# Patient Record
Sex: Male | Born: 1965 | Race: White | Hispanic: No | Marital: Single
Health system: Southern US, Community
[De-identification: ages and names within clinical notes are randomized; demographics above are authoritative.]

## PROBLEM LIST (undated history)

## (undated) DIAGNOSIS — C3490 Malignant neoplasm of unspecified part of unspecified bronchus or lung: Secondary | ICD-10-CM

---

## 2015-01-23 DIAGNOSIS — R51 Headache: Secondary | ICD-10-CM | POA: Diagnosis not present

## 2015-01-23 DIAGNOSIS — R112 Nausea with vomiting, unspecified: Secondary | ICD-10-CM | POA: Diagnosis not present

## 2015-01-23 DIAGNOSIS — F17210 Nicotine dependence, cigarettes, uncomplicated: Secondary | ICD-10-CM | POA: Diagnosis not present

## 2015-01-23 DIAGNOSIS — R531 Weakness: Secondary | ICD-10-CM | POA: Diagnosis not present

## 2015-01-23 DIAGNOSIS — R404 Transient alteration of awareness: Secondary | ICD-10-CM | POA: Diagnosis not present

## 2015-01-23 DIAGNOSIS — Z7982 Long term (current) use of aspirin: Secondary | ICD-10-CM | POA: Diagnosis not present

## 2015-01-23 DIAGNOSIS — R200 Anesthesia of skin: Secondary | ICD-10-CM | POA: Diagnosis not present

## 2015-01-23 DIAGNOSIS — Z8673 Personal history of transient ischemic attack (TIA), and cerebral infarction without residual deficits: Secondary | ICD-10-CM | POA: Diagnosis not present

## 2016-04-24 DIAGNOSIS — M25559 Pain in unspecified hip: Secondary | ICD-10-CM | POA: Diagnosis not present

## 2016-04-24 DIAGNOSIS — M5442 Lumbago with sciatica, left side: Secondary | ICD-10-CM | POA: Diagnosis not present

## 2016-04-24 DIAGNOSIS — M545 Low back pain: Secondary | ICD-10-CM | POA: Diagnosis not present

## 2016-04-24 DIAGNOSIS — G8929 Other chronic pain: Secondary | ICD-10-CM | POA: Diagnosis not present

## 2018-12-13 ENCOUNTER — Emergency Department (HOSPITAL_COMMUNITY): Payer: Medicare Other

## 2018-12-13 ENCOUNTER — Encounter (HOSPITAL_COMMUNITY): Payer: Self-pay | Admitting: Emergency Medicine

## 2018-12-13 ENCOUNTER — Inpatient Hospital Stay (HOSPITAL_COMMUNITY): Payer: Medicare Other

## 2018-12-13 ENCOUNTER — Inpatient Hospital Stay (HOSPITAL_COMMUNITY)
Admission: EM | Admit: 2018-12-13 | Discharge: 2019-01-21 | DRG: 023 | Disposition: E | Payer: Medicare Other | Attending: Neurology | Admitting: Neurology

## 2018-12-13 ENCOUNTER — Other Ambulatory Visit: Payer: Self-pay

## 2018-12-13 DIAGNOSIS — R29810 Facial weakness: Secondary | ICD-10-CM | POA: Diagnosis not present

## 2018-12-13 DIAGNOSIS — Z79899 Other long term (current) drug therapy: Secondary | ICD-10-CM

## 2018-12-13 DIAGNOSIS — Z978 Presence of other specified devices: Secondary | ICD-10-CM | POA: Diagnosis not present

## 2018-12-13 DIAGNOSIS — G935 Compression of brain: Secondary | ICD-10-CM | POA: Diagnosis not present

## 2018-12-13 DIAGNOSIS — J9600 Acute respiratory failure, unspecified whether with hypoxia or hypercapnia: Secondary | ICD-10-CM | POA: Diagnosis not present

## 2018-12-13 DIAGNOSIS — R4701 Aphasia: Secondary | ICD-10-CM | POA: Diagnosis present

## 2018-12-13 DIAGNOSIS — E1165 Type 2 diabetes mellitus with hyperglycemia: Secondary | ICD-10-CM | POA: Diagnosis not present

## 2018-12-13 DIAGNOSIS — J9601 Acute respiratory failure with hypoxia: Secondary | ICD-10-CM | POA: Diagnosis not present

## 2018-12-13 DIAGNOSIS — Z9221 Personal history of antineoplastic chemotherapy: Secondary | ICD-10-CM

## 2018-12-13 DIAGNOSIS — Z85818 Personal history of malignant neoplasm of other sites of lip, oral cavity, and pharynx: Secondary | ICD-10-CM | POA: Diagnosis not present

## 2018-12-13 DIAGNOSIS — Z515 Encounter for palliative care: Secondary | ICD-10-CM | POA: Diagnosis not present

## 2018-12-13 DIAGNOSIS — E785 Hyperlipidemia, unspecified: Secondary | ICD-10-CM | POA: Diagnosis present

## 2018-12-13 DIAGNOSIS — R0902 Hypoxemia: Secondary | ICD-10-CM | POA: Diagnosis not present

## 2018-12-13 DIAGNOSIS — J9690 Respiratory failure, unspecified, unspecified whether with hypoxia or hypercapnia: Secondary | ICD-10-CM

## 2018-12-13 DIAGNOSIS — G932 Benign intracranial hypertension: Secondary | ICD-10-CM | POA: Diagnosis not present

## 2018-12-13 DIAGNOSIS — R1312 Dysphagia, oropharyngeal phase: Secondary | ICD-10-CM | POA: Diagnosis not present

## 2018-12-13 DIAGNOSIS — R414 Neurologic neglect syndrome: Secondary | ICD-10-CM | POA: Diagnosis not present

## 2018-12-13 DIAGNOSIS — R918 Other nonspecific abnormal finding of lung field: Secondary | ICD-10-CM

## 2018-12-13 DIAGNOSIS — E870 Hyperosmolality and hypernatremia: Secondary | ICD-10-CM | POA: Diagnosis not present

## 2018-12-13 DIAGNOSIS — R29724 NIHSS score 24: Secondary | ICD-10-CM | POA: Diagnosis present

## 2018-12-13 DIAGNOSIS — Z7982 Long term (current) use of aspirin: Secondary | ICD-10-CM | POA: Diagnosis not present

## 2018-12-13 DIAGNOSIS — G8191 Hemiplegia, unspecified affecting right dominant side: Secondary | ICD-10-CM | POA: Diagnosis present

## 2018-12-13 DIAGNOSIS — Z9911 Dependence on respirator [ventilator] status: Secondary | ICD-10-CM | POA: Diagnosis not present

## 2018-12-13 DIAGNOSIS — I6389 Other cerebral infarction: Secondary | ICD-10-CM | POA: Diagnosis not present

## 2018-12-13 DIAGNOSIS — Z923 Personal history of irradiation: Secondary | ICD-10-CM

## 2018-12-13 DIAGNOSIS — Z85118 Personal history of other malignant neoplasm of bronchus and lung: Secondary | ICD-10-CM | POA: Diagnosis not present

## 2018-12-13 DIAGNOSIS — R069 Unspecified abnormalities of breathing: Secondary | ICD-10-CM | POA: Diagnosis not present

## 2018-12-13 DIAGNOSIS — G936 Cerebral edema: Secondary | ICD-10-CM | POA: Diagnosis present

## 2018-12-13 DIAGNOSIS — I10 Essential (primary) hypertension: Secondary | ICD-10-CM | POA: Diagnosis present

## 2018-12-13 DIAGNOSIS — R0602 Shortness of breath: Secondary | ICD-10-CM | POA: Diagnosis not present

## 2018-12-13 DIAGNOSIS — G9340 Encephalopathy, unspecified: Secondary | ICD-10-CM | POA: Diagnosis not present

## 2018-12-13 DIAGNOSIS — Z66 Do not resuscitate: Secondary | ICD-10-CM | POA: Diagnosis not present

## 2018-12-13 DIAGNOSIS — E119 Type 2 diabetes mellitus without complications: Secondary | ICD-10-CM | POA: Diagnosis not present

## 2018-12-13 DIAGNOSIS — R1310 Dysphagia, unspecified: Secondary | ICD-10-CM | POA: Diagnosis present

## 2018-12-13 DIAGNOSIS — R4781 Slurred speech: Secondary | ICD-10-CM | POA: Diagnosis not present

## 2018-12-13 DIAGNOSIS — F17210 Nicotine dependence, cigarettes, uncomplicated: Secondary | ICD-10-CM | POA: Diagnosis present

## 2018-12-13 DIAGNOSIS — I63512 Cerebral infarction due to unspecified occlusion or stenosis of left middle cerebral artery: Principal | ICD-10-CM | POA: Diagnosis present

## 2018-12-13 DIAGNOSIS — I63412 Cerebral infarction due to embolism of left middle cerebral artery: Secondary | ICD-10-CM | POA: Diagnosis not present

## 2018-12-13 DIAGNOSIS — J189 Pneumonia, unspecified organism: Secondary | ICD-10-CM | POA: Diagnosis not present

## 2018-12-13 DIAGNOSIS — R740 Nonspecific elevation of levels of transaminase and lactic acid dehydrogenase [LDH]: Secondary | ICD-10-CM | POA: Diagnosis not present

## 2018-12-13 DIAGNOSIS — I6523 Occlusion and stenosis of bilateral carotid arteries: Secondary | ICD-10-CM | POA: Diagnosis not present

## 2018-12-13 DIAGNOSIS — I639 Cerebral infarction, unspecified: Secondary | ICD-10-CM

## 2018-12-13 DIAGNOSIS — J398 Other specified diseases of upper respiratory tract: Secondary | ICD-10-CM

## 2018-12-13 DIAGNOSIS — Z9889 Other specified postprocedural states: Secondary | ICD-10-CM | POA: Diagnosis not present

## 2018-12-13 DIAGNOSIS — Z4682 Encounter for fitting and adjustment of non-vascular catheter: Secondary | ICD-10-CM | POA: Diagnosis not present

## 2018-12-13 DIAGNOSIS — E876 Hypokalemia: Secondary | ICD-10-CM | POA: Diagnosis not present

## 2018-12-13 DIAGNOSIS — D72829 Elevated white blood cell count, unspecified: Secondary | ICD-10-CM | POA: Diagnosis not present

## 2018-12-13 DIAGNOSIS — R509 Fever, unspecified: Secondary | ICD-10-CM | POA: Diagnosis not present

## 2018-12-13 DIAGNOSIS — R0689 Other abnormalities of breathing: Secondary | ICD-10-CM | POA: Diagnosis not present

## 2018-12-13 HISTORY — DX: Malignant neoplasm of unspecified part of unspecified bronchus or lung: C34.90

## 2018-12-13 LAB — DIFFERENTIAL
Abs Immature Granulocytes: 0.16 10*3/uL — ABNORMAL HIGH (ref 0.00–0.07)
BASOS ABS: 0.1 10*3/uL (ref 0.0–0.1)
Basophils Relative: 0 %
Eosinophils Absolute: 0 10*3/uL (ref 0.0–0.5)
Eosinophils Relative: 0 %
Immature Granulocytes: 1 %
Lymphocytes Relative: 3 %
Lymphs Abs: 0.6 10*3/uL — ABNORMAL LOW (ref 0.7–4.0)
MONO ABS: 0.5 10*3/uL (ref 0.1–1.0)
Monocytes Relative: 2 %
Neutro Abs: 21.7 10*3/uL — ABNORMAL HIGH (ref 1.7–7.7)
Neutrophils Relative %: 94 %

## 2018-12-13 LAB — CBC
HCT: 49.3 % (ref 39.0–52.0)
Hemoglobin: 15.9 g/dL (ref 13.0–17.0)
MCH: 32.4 pg (ref 26.0–34.0)
MCHC: 32.3 g/dL (ref 30.0–36.0)
MCV: 100.6 fL — ABNORMAL HIGH (ref 80.0–100.0)
Platelets: 348 10*3/uL (ref 150–400)
RBC: 4.9 MIL/uL (ref 4.22–5.81)
RDW: 12.2 % (ref 11.5–15.5)
WBC: 23 10*3/uL — AB (ref 4.0–10.5)
nRBC: 0 % (ref 0.0–0.2)

## 2018-12-13 LAB — COMPREHENSIVE METABOLIC PANEL
ALT: 20 U/L (ref 0–44)
AST: 24 U/L (ref 15–41)
Albumin: 4.1 g/dL (ref 3.5–5.0)
Alkaline Phosphatase: 87 U/L (ref 38–126)
Anion gap: 13 (ref 5–15)
BUN: 11 mg/dL (ref 6–20)
CALCIUM: 9.3 mg/dL (ref 8.9–10.3)
CO2: 24 mmol/L (ref 22–32)
Chloride: 97 mmol/L — ABNORMAL LOW (ref 98–111)
Creatinine, Ser: 1.05 mg/dL (ref 0.61–1.24)
GFR calc Af Amer: >60 mL/min (ref >60)
GFR calc non Af Amer: >60 mL/min (ref >60)
Glucose, Bld: 165 mg/dL — ABNORMAL HIGH (ref 70–99)
Potassium: 4.6 mmol/L (ref 3.5–5.1)
Sodium: 134 mmol/L — ABNORMAL LOW (ref 135–145)
Total Bilirubin: 0.8 mg/dL (ref 0.3–1.2)
Total Protein: 7.5 g/dL (ref 6.5–8.1)

## 2018-12-13 LAB — RETICULOCYTES
IMMATURE RETIC FRACT: 6.2 % (ref 2.3–15.9)
RBC.: 4.87 MIL/uL (ref 4.22–5.81)
Retic Count, Absolute: 65.7 10*3/uL (ref 19.0–186.0)
Retic Ct Pct: 1.4 % (ref 0.4–3.1)

## 2018-12-13 LAB — PROTIME-INR
INR: 0.95
Prothrombin Time: 12.6 seconds (ref 11.4–15.2)

## 2018-12-13 LAB — GLUCOSE, CAPILLARY: Glucose-Capillary: 131 mg/dL — ABNORMAL HIGH (ref 70–99)

## 2018-12-13 LAB — CBG MONITORING, ED: Glucose-Capillary: 156 mg/dL — ABNORMAL HIGH (ref 70–99)

## 2018-12-13 LAB — APTT: aPTT: 29 seconds (ref 24–36)

## 2018-12-13 LAB — I-STAT CREATININE, ED: Creatinine, Ser: 1.1 mg/dL (ref 0.61–1.24)

## 2018-12-13 LAB — I-STAT TROPONIN, ED: Troponin i, poc: 0 ng/mL (ref 0.00–0.08)

## 2018-12-13 MED ORDER — SENNOSIDES-DOCUSATE SODIUM 8.6-50 MG PO TABS: 1.0000 | ORAL_TABLET | Freq: Every evening | ORAL | Status: DC | PRN

## 2018-12-13 MED ORDER — ACETAMINOPHEN 650 MG RE SUPP: 650.0000 mg | RECTAL | Status: DC | PRN

## 2018-12-13 MED ORDER — ACETAMINOPHEN 325 MG PO TABS: 650.0000 mg | ORAL_TABLET | ORAL | Status: DC | PRN

## 2018-12-13 MED ORDER — SODIUM CHLORIDE 0.9% FLUSH: 3.0000 mL | Freq: Once | INTRAVENOUS | Status: DC

## 2018-12-13 MED ORDER — IOPAMIDOL (ISOVUE-370) INJECTION 76%
100.0000 mL | Freq: Once | INTRAVENOUS | Status: AC | PRN
  Administered 2018-12-13: 100 mL via INTRAVENOUS

## 2018-12-13 MED ORDER — ACETAMINOPHEN 160 MG/5ML PO SOLN
650.0000 mg | ORAL | Status: DC | PRN
  Administered 2018-12-16 – 2018-12-22 (×10): 650 mg
  Filled 2018-12-13 (×10): qty 20.3

## 2018-12-13 MED ORDER — SODIUM CHLORIDE 0.9 % IV SOLN
INTRAVENOUS | Status: DC
  Administered 2018-12-13: 23:00:00 via INTRAVENOUS

## 2018-12-13 MED ORDER — STROKE: EARLY STAGES OF RECOVERY BOOK
Freq: Once | Status: DC
  Filled 2018-12-13: qty 1

## 2018-12-13 NOTE — H&P (Signed)
Chief Complaint: Nonverbal, not moving right side   History obtained from: Patient and Chart     HPI:                                                                                                                                       Joshua Beck is an 53 y.o. 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  Last known normal was 4 PM this afternoon seen by daughter.  The patient lives with his daughter and was talking normally.  The daughter went to the grocery store and when she came back she noticed the patient was not speaking and called EMS.  On assessment patient was nonverbal, a gaze deviation and weak on the right side. Blood pressure was 297 systolic.    ED course:  On arrival to [facility] emergency room patient underwent a stat CT head which already showed a large evolving left MCA infarction.  CT aspects was a 3.  Patient not a candidate for TPA is outside the 4.5-hour window.  Not a candidate for intervention due to already large infarct.    Date last known well: 1.22.20 Time last known well: 4pm tPA Given: no,outside window  NIHSS: 24 Baseline MRS 0-1     Past Medical History:  Diagnosis Date  . Lung cancer ([place])      No family history on file.\  Social History: active smoker Allergies: No Known Allergies  Medications:                                                                                                                        I reviewed home medications. Non compliant on ASA    ROS:  Unable to review systems as patient is aphasic   Examination:                                                                                                       General: Appears well-developed  Psych: Affect appropriate to situation Eyes: No scleral injection HENT: No OP obstrucion Head: Normocephalic.  Cardiovascular: Normal rate and regular rhythm.  Respiratory: Effort normal and breath sounds normal to anterior ascultation GI: Soft.  No distension. There is no tenderness.  Skin: WDI    Neurological Examination Mental Status: Alert, mute.  Does not follow any commands. Cranial Nerves: II: Visual fields: Right homonymous hemianopsia III,IV, VI: ptosis not present, forced gaze deviation to the left side, pupils equal, round, reactive to light and accommodation VII: right facial droop VIII: unable to assess IX,X: unable to assess XI: unable to assess XII: midline tongue extension Motor: Right : Upper extremity   0/5    Left:     Upper extremity   5/5  Lower extremity   4/5     Lower extremity   5/5 Tone and bulk:normal tone throughout; no atrophy noted Sensory: reduced withdrawal on the right side  Deep Tendon Reflexes: 2+ and symmetric throughout Plantars: Right: downgoing   Left: downgoing Cerebellar:  Gait: unable to assess     Lab Results: Basic Metabolic Panel: Recent Labs  Lab 11/23/2018 2043 12/21/2018 2052  NA 134*  --   K 4.6  --   CL 97*  --   CO2 24  --   GLUCOSE 165*  --   BUN 11  --   CREATININE 1.05 1.10  CALCIUM 9.3  --     CBC: Recent Labs  Lab 12/11/2018 2043  WBC 23.0*  NEUTROABS 21.7*  HGB 15.9  HCT 49.3  MCV 100.6*  PLT 348    Coagulation Studies: Recent Labs    12/15/2018 2043  LABPROT 12.6  INR 0.95    Imaging: Ct Angio Head W Or Wo Contrast  Result Date: 12/08/2018 CLINICAL DATA:  Right-sided weakness and slurred speech EXAM: CT ANGIOGRAPHY HEAD AND NECK CT PERFUSION BRAIN TECHNIQUE: Multidetector CT imaging of the head and neck was performed using the standard protocol during bolus administration of intravenous contrast. Multiplanar CT image reconstructions and MIPs  were obtained to evaluate the vascular anatomy. Carotid stenosis measurements (when applicable) are obtained utilizing NASCET criteria, using the distal internal carotid diameter as the denominator. Multiphase CT imaging of the brain was performed following IV bolus contrast injection. Subsequent parametric perfusion maps were calculated using RAPID software. CONTRAST:  122mL ISOVUE-370 IOPAMIDOL (ISOVUE-370) INJECTION 76% COMPARISON:  None. FINDINGS: ASPECTS ([place] Stroke Program Early CT Score) from earlier noncontrast head CT: 3 CTA NECK FINDINGS SKELETON: There is no bony spinal canal stenosis. No lytic or blastic lesion. OTHER NECK: Normal pharynx, larynx and major salivary glands. No cervical lymphadenopathy. Unremarkable thyroid gland. UPPER CHEST: No pneumothorax or pleural effusion. No nodules or masses. AORTIC ARCH: There is no calcific atherosclerosis of the aortic arch. There is no aneurysm, dissection or hemodynamically significant  stenosis of the visualized ascending aorta and aortic arch. Conventional 3 vessel aortic branching pattern. The visualized proximal subclavian arteries are widely patent. RIGHT CAROTID SYSTEM: --Common carotid artery: Widely patent origin without common carotid artery dissection or aneurysm. --Internal carotid artery: No dissection, occlusion or aneurysm. There is hypodense atherosclerosis extending into the proximal ICA, resulting in 70% stenosis. --External carotid artery: No acute abnormality. LEFT CAROTID SYSTEM: --Common carotid artery: There is diffuse narrowing of the left common carotid artery with near complete occlusion proximal to the bifurcation. --Internal carotid artery: There is diffuse narrowing of the internal carotid artery. By NASCET criteria, the stenosis measures less than 50%. --External carotid artery: No acute abnormality. VERTEBRAL ARTERIES: Codominant configuration. Both origins are normal. No dissection, occlusion or flow-limiting stenosis to the  vertebrobasilar confluence. CTA HEAD FINDINGS POSTERIOR CIRCULATION: --Basilar artery: Normal. --Posterior cerebral arteries: Normal. The right PCA is predominantly supplied by the posterior communicating artery. --Superior cerebellar arteries: Normal. --Inferior cerebellar arteries: Normal anterior and posterior inferior cerebellar arteries. ANTERIOR CIRCULATION: --Intracranial internal carotid arteries: Mild narrowing of the skull base left ICA. --Anterior cerebral arteries: Normal --Middle cerebral arteries: Complete occlusion of the left MCA with essentially no collateralization. Normal right MCA. --Posterior communicating arteries: Present bilaterally. VENOUS SINUSES: As permitted by contrast timing, patent. ANATOMIC VARIANTS: None DELAYED PHASE: Not performed. Review of the MIP images confirms the above findings. CT Brain Perfusion Findings: CBF (<30%) Volume: 175mL Perfusion (Tmax>6.0s) volume: 221mL Mismatch Volume: 21mL Infarction Location:Left MCA distribution IMPRESSION: 1. Complete occlusion of the left middle cerebral artery with no collateralization. 2. Large core infarct of 194 mL in the left MCA territory. 3. Diffuse narrowing of the left carotid system, with short segment occlusion of the distal left common carotid artery. 4. 70% stenosis of the right internal carotid artery at the bifurcation. 5. ASPECTS is 3. Critical Value/emergent results were called by telephone at the time of interpretation on 12/02/2018 at 9:23 pm to Dr. Samara Snide , who verbally acknowledged these results. Electronically Signed   By: Ulyses Jarred M.D.   On: 12/21/2018 21:40   Ct Angio Neck W And/or Wo Contrast  Result Date: 12/22/2018 CLINICAL DATA:  Right-sided weakness and slurred speech EXAM: CT ANGIOGRAPHY HEAD AND NECK CT PERFUSION BRAIN TECHNIQUE: Multidetector CT imaging of the head and neck was performed using the standard protocol during bolus administration of intravenous contrast. Multiplanar CT image  reconstructions and MIPs were obtained to evaluate the vascular anatomy. Carotid stenosis measurements (when applicable) are obtained utilizing NASCET criteria, using the distal internal carotid diameter as the denominator. Multiphase CT imaging of the brain was performed following IV bolus contrast injection. Subsequent parametric perfusion maps were calculated using RAPID software. CONTRAST:  136mL ISOVUE-370 IOPAMIDOL (ISOVUE-370) INJECTION 76% COMPARISON:  None. FINDINGS: ASPECTS ([place] Stroke Program Early CT Score) from earlier noncontrast head CT: 3 CTA NECK FINDINGS SKELETON: There is no bony spinal canal stenosis. No lytic or blastic lesion. OTHER NECK: Normal pharynx, larynx and major salivary glands. No cervical lymphadenopathy. Unremarkable thyroid gland. UPPER CHEST: No pneumothorax or pleural effusion. No nodules or masses. AORTIC ARCH: There is no calcific atherosclerosis of the aortic arch. There is no aneurysm, dissection or hemodynamically significant stenosis of the visualized ascending aorta and aortic arch. Conventional 3 vessel aortic branching pattern. The visualized proximal subclavian arteries are widely patent. RIGHT CAROTID SYSTEM: --Common carotid artery: Widely patent origin without common carotid artery dissection or aneurysm. --Internal carotid artery: No dissection, occlusion or aneurysm. There is hypodense atherosclerosis extending  into the proximal ICA, resulting in 70% stenosis. --External carotid artery: No acute abnormality. LEFT CAROTID SYSTEM: --Common carotid artery: There is diffuse narrowing of the left common carotid artery with near complete occlusion proximal to the bifurcation. --Internal carotid artery: There is diffuse narrowing of the internal carotid artery. By NASCET criteria, the stenosis measures less than 50%. --External carotid artery: No acute abnormality. VERTEBRAL ARTERIES: Codominant configuration. Both origins are normal. No dissection, occlusion or  flow-limiting stenosis to the vertebrobasilar confluence. CTA HEAD FINDINGS POSTERIOR CIRCULATION: --Basilar artery: Normal. --Posterior cerebral arteries: Normal. The right PCA is predominantly supplied by the posterior communicating artery. --Superior cerebellar arteries: Normal. --Inferior cerebellar arteries: Normal anterior and posterior inferior cerebellar arteries. ANTERIOR CIRCULATION: --Intracranial internal carotid arteries: Mild narrowing of the skull base left ICA. --Anterior cerebral arteries: Normal --Middle cerebral arteries: Complete occlusion of the left MCA with essentially no collateralization. Normal right MCA. --Posterior communicating arteries: Present bilaterally. VENOUS SINUSES: As permitted by contrast timing, patent. ANATOMIC VARIANTS: None DELAYED PHASE: Not performed. Review of the MIP images confirms the above findings. CT Brain Perfusion Findings: CBF (<30%) Volume: 144mL Perfusion (Tmax>6.0s) volume: 210mL Mismatch Volume: 35mL Infarction Location:Left MCA distribution IMPRESSION: 1. Complete occlusion of the left middle cerebral artery with no collateralization. 2. Large core infarct of 194 mL in the left MCA territory. 3. Diffuse narrowing of the left carotid system, with short segment occlusion of the distal left common carotid artery. 4. 70% stenosis of the right internal carotid artery at the bifurcation. 5. ASPECTS is 3. Critical Value/emergent results were called by telephone at the time of interpretation on 11/24/2018 at 9:23 pm to Dr. Samara Snide , who verbally acknowledged these results. Electronically Signed   By: Ulyses Jarred M.D.   On: 12/10/2018 21:40   Ct Cerebral Perfusion W Contrast  Result Date: 12/05/2018 CLINICAL DATA:  Right-sided weakness and slurred speech EXAM: CT ANGIOGRAPHY HEAD AND NECK CT PERFUSION BRAIN TECHNIQUE: Multidetector CT imaging of the head and neck was performed using the standard protocol during bolus administration of intravenous  contrast. Multiplanar CT image reconstructions and MIPs were obtained to evaluate the vascular anatomy. Carotid stenosis measurements (when applicable) are obtained utilizing NASCET criteria, using the distal internal carotid diameter as the denominator. Multiphase CT imaging of the brain was performed following IV bolus contrast injection. Subsequent parametric perfusion maps were calculated using RAPID software. CONTRAST:  161mL ISOVUE-370 IOPAMIDOL (ISOVUE-370) INJECTION 76% COMPARISON:  None. FINDINGS: ASPECTS ([place] Stroke Program Early CT Score) from earlier noncontrast head CT: 3 CTA NECK FINDINGS SKELETON: There is no bony spinal canal stenosis. No lytic or blastic lesion. OTHER NECK: Normal pharynx, larynx and major salivary glands. No cervical lymphadenopathy. Unremarkable thyroid gland. UPPER CHEST: No pneumothorax or pleural effusion. No nodules or masses. AORTIC ARCH: There is no calcific atherosclerosis of the aortic arch. There is no aneurysm, dissection or hemodynamically significant stenosis of the visualized ascending aorta and aortic arch. Conventional 3 vessel aortic branching pattern. The visualized proximal subclavian arteries are widely patent. RIGHT CAROTID SYSTEM: --Common carotid artery: Widely patent origin without common carotid artery dissection or aneurysm. --Internal carotid artery: No dissection, occlusion or aneurysm. There is hypodense atherosclerosis extending into the proximal ICA, resulting in 70% stenosis. --External carotid artery: No acute abnormality. LEFT CAROTID SYSTEM: --Common carotid artery: There is diffuse narrowing of the left common carotid artery with near complete occlusion proximal to the bifurcation. --Internal carotid artery: There is diffuse narrowing of the internal carotid artery. By NASCET criteria, the  stenosis measures less than 50%. --External carotid artery: No acute abnormality. VERTEBRAL ARTERIES: Codominant configuration. Both origins are normal. No  dissection, occlusion or flow-limiting stenosis to the vertebrobasilar confluence. CTA HEAD FINDINGS POSTERIOR CIRCULATION: --Basilar artery: Normal. --Posterior cerebral arteries: Normal. The right PCA is predominantly supplied by the posterior communicating artery. --Superior cerebellar arteries: Normal. --Inferior cerebellar arteries: Normal anterior and posterior inferior cerebellar arteries. ANTERIOR CIRCULATION: --Intracranial internal carotid arteries: Mild narrowing of the skull base left ICA. --Anterior cerebral arteries: Normal --Middle cerebral arteries: Complete occlusion of the left MCA with essentially no collateralization. Normal right MCA. --Posterior communicating arteries: Present bilaterally. VENOUS SINUSES: As permitted by contrast timing, patent. ANATOMIC VARIANTS: None DELAYED PHASE: Not performed. Review of the MIP images confirms the above findings. CT Brain Perfusion Findings: CBF (<30%) Volume: 194mL Perfusion (Tmax>6.0s) volume: 213mL Mismatch Volume: 14mL Infarction Location:Left MCA distribution IMPRESSION: 1. Complete occlusion of the left middle cerebral artery with no collateralization. 2. Large core infarct of 194 mL in the left MCA territory. 3. Diffuse narrowing of the left carotid system, with short segment occlusion of the distal left common carotid artery. 4. 70% stenosis of the right internal carotid artery at the bifurcation. 5. ASPECTS is 3. Critical Value/emergent results were called by telephone at the time of interpretation on 12/18/2018 at 9:23 pm to Dr. Samara Snide , who verbally acknowledged these results. Electronically Signed   By: Ulyses Jarred M.D.   On: 12/17/2018 21:40   Dg Chest Portable 1 View  Result Date: 12/02/2018 CLINICAL DATA:  Stroke EXAM: PORTABLE CHEST 1 VIEW COMPARISON:  01/09/2011 FINDINGS: The heart size and mediastinal contours are within normal limits. Both lungs are clear. The visualized skeletal structures are unremarkable. IMPRESSION: No  active disease. Electronically Signed   By: Donavan Foil M.D.   On: 11/23/2018 22:13   Ct Head Code Stroke Wo Contrast  Result Date: 12/12/2018 CLINICAL DATA:  Code stroke. Right-sided weakness with vomiting and slurred speech EXAM: CT HEAD WITHOUT CONTRAST TECHNIQUE: Contiguous axial images were obtained from the base of the skull through the vertex without intravenous contrast. COMPARISON:  None. FINDINGS: Brain: There is no mass, hemorrhage or extra-axial collection. The size and configuration of the ventricles and extra-axial CSF spaces are normal. There is a large area acute ischemia within the left hemisphere, within the left MCA territory. Vascular: Hyperdense left MCA. Skull: The visualized skull base, calvarium and extracranial soft tissues are normal. Sinuses/Orbits: No fluid levels or advanced mucosal thickening of the visualized paranasal sinuses. No mastoid or middle ear effusion. The orbits are normal. ASPECTS [facility] Stroke Program Early CT Score) - Ganglionic level infarction (caudate, lentiform nuclei, internal capsule, insula, M1-M3 cortex): 3 - Supraganglionic infarction (M4-M6 cortex): 0 Total score (0-10 with 10 being normal): 3 IMPRESSION: 1. No acute hemorrhage. 2. Large area of acute ischemia within the left MCA territory with hyperdense left MCA. 3. ASPECTS is 3. * These results were communicated to Dr. Karena Addison Jovonna Nickell at 8:58 pm on 12/19/2018 by text page via the [facility] messaging system. Electronically Signed   By: Ulyses Jarred M.D.   On: 12/09/2018 20:59     ASSESSMENT AND PLAN  53 y.o. 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  CT angiogram shows diffuse narrowing of the left carotid artery with segment of occlusion of the distal left common carotid as well  as  occlusion of the left MCA and 70% stenosis  of the right internal carotid artery.  The patient was not a candidate for IR despite presenting within 6 hours due to rapid progression of the infarct.  CT perfusion showed no mismatch with large score of 194 cc.  Reason for rapid progression is likely due to bilateral carotid stenosis/left carotid occlusion resulting in poor collaterals.  The reason for advanced atherosclerotic changes is likely from receiving radiation.  Patient also smoker and not compliant on aspirin patient.   Large Left MCA infarction 2/2 tandem occlusion of Left MCA and left common carotid artery Cytotoxic cerebral edema Bilateral carotid stenosis Aphasia Dominant Hemiplegia   #Large Left MCA infarction 2/2 tandem occlusion of Left MCA and left common carotid artery  Risk factors: Radiation therapy, cancer, hypertension, tobacco abuse Etiology: Atheroembolic  Plan # Repeat CT head at 4am #Transthoracic Echo  # Hold ASA for now, may need hemicraniectomy #Start or continue Atorvastatin 40 mg/other high intensity statin  # BP goal: permissive HTN upto 220/120 mmHg # HBAIC and Lipid profile # Telemetry monitoring # Frequent neuro checks # NPO   #Cytotoxic cerebral edema with high concern of developing malignant cerebral edema -We will enroll patient in CHARM study (randomized to receive IV glyburide versus placebo for prevention of melena and cerebral edema) - repeat CT head at 4am - consider hypertonic saline and NS consult for hemicraniectomy if repeat CT Head shows midline shift - avoid hypotonic fluids  #Hypertension -BP goals permissive hypertension up to 220/120 mmHg  Code status: partial, No chest compressions, ACLS - ok to intubate for airway protection DVT PPX:  SCD    Please page stroke NP  Or  PA  Or MD from 8am -4 pm  as this patient from this time will be  followed by the stroke.   You can look them up on [email or web address]  Password [email or web address]    This patient is neurologically critically ill due to  large left MCA stroke.  He is at risk for significant risk of neurological worsening from cerebral edema,  death from brain herniation, heart failure, hemorrhagic conversion, infection, respiratory failure and seizure. This patient's care requires constant monitoring of vital signs, hemodynamics, respiratory and cardiac monitoring, review of multiple databases, neurological assessment, discussion with family, other specialists and medical decision making of high complexity.  I spent 70  minutes of neurocritical time in the care of this patient.     Connee Ikner [facility] Pager Number [phone]

## 2018-12-13 NOTE — ED Provider Notes (Signed)
[facility] NEURO/TRAUMA/SURGICAL ICU Provider Note   CSN: [id number] Arrival date & time: 11/24/2018  2041   An emergency department physician performed an initial assessment on this suspected stroke patient at 2042.  History   Chief Complaint Chief Complaint  Patient presents with  . Code Stroke    HPI Joshua Beck is a 53 y.o. male. Level 5 caveat due to nonverbal status. HPI Patient presented as code stroke.  Last normal at 4:00.  [place].  Unable to move right arm and minimally verbal.  Does appear to have some receptive aphasia also.  Had vomited for EMS.  Reported history of some sort of cancer previously but reviewed it may have been 10 years ago.  Not moving right side appears to neglect. Past Medical History:  Diagnosis Date  . Lung cancer [facility])     Patient Active Problem List   Diagnosis Date Noted  . Acute ischemic left MCA stroke ([place]) 12/15/2018       Home Medications    Prior to Admission medications   Medication Sig Start Date End Date Taking? Authorizing Provider  aspirin EC 81 MG tablet Take 81 mg by mouth 3 (three) times a week.   Yes [provider]  gabapentin (NEURONTIN) 300 MG capsule Take 300 mg by mouth 4 (four) times daily as needed (for nerve pain).   Yes [provider]  hydrochlorothiazide (MICROZIDE) 12.5 MG capsule Take 12.5 mg by mouth daily.   Yes [provider]  HYDROcodone-acetaminophen (NORCO) 10-325 MG tablet Take 1 tablet by mouth daily as needed (for pain).   Yes [provider]  lisinopril (PRINIVIL,ZESTRIL) 10 MG tablet Take 10 mg by mouth daily.   Yes [provider]  morphine (MS CONTIN) 30 MG 12 hr tablet Take 30 mg by mouth at bedtime as needed for pain.   Yes [provider]  multivitamin (ONE-A-DAY MEN'S) TABS tablet Take 1 tablet by mouth daily.   Yes [provider]  traZODone (DESYREL) 50 MG tablet Take 50 mg by mouth at bedtime as  needed for sleep.   Yes [provider]    Family History No family history on file.  Social History Social History   Tobacco Use  . Smoking status: Not on file  Substance Use Topics  . Alcohol use: Not on file  . Drug use: Not on file     Allergies   Patient has no known allergies.   Review of Systems Review of Systems  Unable to perform ROS: Patient nonverbal     Physical Exam Updated Vital Signs BP 121/86   Pulse (!) 101   Temp 98.1 F (36.7 C) (Temporal)   Resp (!) 22   Ht 6' (1.829 m)   Wt 69.6 kg   SpO2 95%   BMI 20.81 kg/m   Physical Exam Constitutional:      Comments: Sitting up in stretcher with vomit on..  Looks place but will not speak for me.  HENT:     Head: Atraumatic.     Nose: Nose normal.     Mouth/Throat:     Mouth: Mucous membranes are moist.  Eyes:     Comments: Eyes will not pass to the right side.  Neck:     Musculoskeletal: Neck supple.  Cardiovascular:     Rate and Rhythm: Normal rate.  Pulmonary:     Breath sounds: No wheezing or rhonchi.  Chest:     Chest wall:  No tenderness.  Abdominal:     Tenderness: There is no abdominal tenderness.  Musculoskeletal:        General: No deformity.  Skin:    General: Skin is warm.     Capillary Refill: Capillary refill takes less than 2 seconds.  Neurological:     Comments: Right-sided facial droop.  Eyes will not pass to right.  Not moving right side.  Vomit on beard.  Minimally verbal.  Will not tell me his name.  However when asked to squeeze my hand he will and then released it on command also.  No threat on right side.  Complete NIH scoring done by neurology.  Psychiatric:        Mood and Affect: Mood normal.      ED Treatments / Results  Labs (all labs ordered are listed, but only abnormal results are displayed) Labs Reviewed  CBC - Abnormal; Notable for the following components:      Result Value   WBC 23.0 (*)    MCV 100.6 (*)    All other components within  normal limits  DIFFERENTIAL - Abnormal; Notable for the following components:   Neutro Abs 21.7 (*)    Lymphs Abs 0.6 (*)    Abs Immature Granulocytes 0.16 (*)    All other components within normal limits  COMPREHENSIVE METABOLIC PANEL - Abnormal; Notable for the following components:   Sodium 134 (*)    Chloride 97 (*)    Glucose, Bld 165 (*)    All other components within normal limits  GLUCOSE, CAPILLARY - Abnormal; Notable for the following components:   Glucose-Capillary 131 (*)    All other components within normal limits  CBG MONITORING, ED - Abnormal; Notable for the following components:   Glucose-Capillary 156 (*)    All other components within normal limits  MRSA PCR SCREENING  PROTIME-INR  APTT  HIV ANTIBODY (ROUTINE TESTING W REFLEX)  HEMOGLOBIN A1C  LIPID PANEL  GAMMA GT  PHOSPHORUS  RETICULOCYTES  URIC ACID  I-STAT TROPONIN, ED  I-STAT CREATININE, ED    EKG EKG Interpretation  Date/Time:  Wednesday December 13 2018 21:19:42 EST Ventricular Rate:  105 PR Interval:    QRS Duration: 90 QT Interval:  305 QTC Calculation: 403 R Axis:   72 Text Interpretation:  Sinus tachycardia Biatrial enlargement Minimal ST depression, diffuse leads No previous ECGs available Confirmed by Wandra Arthurs [phone]) on 12/17/2018 9:24:44 PM   Radiology Ct Angio Head W Or Wo Contrast  Result Date: 12/02/2018 CLINICAL DATA:  Right-sided weakness and slurred speech EXAM: CT ANGIOGRAPHY HEAD AND NECK CT PERFUSION BRAIN TECHNIQUE: Multidetector CT imaging of the head and neck was performed using the standard protocol during bolus administration of intravenous contrast. Multiplanar CT image reconstructions and MIPs were obtained to evaluate the vascular anatomy. Carotid stenosis measurements (when applicable) are obtained utilizing NASCET criteria, using the distal internal carotid diameter as the denominator. Multiphase CT imaging of the brain was performed following IV bolus contrast  injection. Subsequent parametric perfusion maps were calculated using RAPID software. CONTRAST:  140m ISOVUE-370 IOPAMIDOL (ISOVUE-370) INJECTION 76% COMPARISON:  None. FINDINGS: ASPECTS ([place]roke Program Early CT Score) from earlier noncontrast head CT: 3 CTA NECK FINDINGS SKELETON: There is no bony spinal canal stenosis. No lytic or blastic lesion. OTHER NECK: Normal pharynx, larynx and major salivary glands. No cervical lymphadenopathy. Unremarkable thyroid gland. UPPER CHEST: No pneumothorax or pleural effusion. No nodules or masses. AORTIC ARCH: There is no calcific atherosclerosis  of the aortic arch. There is no aneurysm, dissection or hemodynamically significant stenosis of the visualized ascending aorta and aortic arch. Conventional 3 vessel aortic branching pattern. The visualized proximal subclavian arteries are widely patent. RIGHT CAROTID SYSTEM: --Common carotid artery: Widely patent origin without common carotid artery dissection or aneurysm. --Internal carotid artery: No dissection, occlusion or aneurysm. There is hypodense atherosclerosis extending into the proximal ICA, resulting in 70% stenosis. --External carotid artery: No acute abnormality. LEFT CAROTID SYSTEM: --Common carotid artery: There is diffuse narrowing of the left common carotid artery with near complete occlusion proximal to the bifurcation. --Internal carotid artery: There is diffuse narrowing of the internal carotid artery. By NASCET criteria, the stenosis measures less than 50%. --External carotid artery: No acute abnormality. VERTEBRAL ARTERIES: Codominant configuration. Both origins are normal. No dissection, occlusion or flow-limiting stenosis to the vertebrobasilar confluence. CTA HEAD FINDINGS POSTERIOR CIRCULATION: --Basilar artery: Normal. --Posterior cerebral arteries: Normal. The right PCA is predominantly supplied by the posterior communicating artery. --Superior cerebellar arteries: Normal. --Inferior cerebellar  arteries: Normal anterior and posterior inferior cerebellar arteries. ANTERIOR CIRCULATION: --Intracranial internal carotid arteries: Mild narrowing of the skull base left ICA. --Anterior cerebral arteries: Normal --Middle cerebral arteries: Complete occlusion of the left MCA with essentially no collateralization. Normal right MCA. --Posterior communicating arteries: Present bilaterally. VENOUS SINUSES: As permitted by contrast timing, patent. ANATOMIC VARIANTS: None DELAYED PHASE: Not performed. Review of the MIP images confirms the above findings. CT Brain Perfusion Findings: CBF (<30%) Volume: 1340m Perfusion (Tmax>6.0s) volume: 2044mMismatch Volume: 40m37mnfarction Location:Left MCA distribution IMPRESSION: 1. Complete occlusion of the left middle cerebral artery with no collateralization. 2. Large core infarct of 194 mL in the left MCA territory. 3. Diffuse narrowing of the left carotid system, with short segment occlusion of the distal left common carotid artery. 4. 70% stenosis of the right internal carotid artery at the bifurcation. 5. ASPECTS is 3. Critical Value/emergent results were called by telephone at the time of interpretation on 12/12/2018 at 9:23 pm to Dr. SUSSamara Snidewho verbally acknowledged these results. Electronically Signed   By: KevUlyses JarredD.   On: 12/04/2018 21:40   Ct Angio Neck W And/or Wo Contrast  Result Date: 12/03/2018 CLINICAL DATA:  Right-sided weakness and slurred speech EXAM: CT ANGIOGRAPHY HEAD AND NECK CT PERFUSION BRAIN TECHNIQUE: Multidetector CT imaging of the head and neck was performed using the standard protocol during bolus administration of intravenous contrast. Multiplanar CT image reconstructions and MIPs were obtained to evaluate the vascular anatomy. Carotid stenosis measurements (when applicable) are obtained utilizing NASCET criteria, using the distal internal carotid diameter as the denominator. Multiphase CT imaging of the brain was performed  following IV bolus contrast injection. Subsequent parametric perfusion maps were calculated using RAPID software. CONTRAST:  100m82mOVUE-370 IOPAMIDOL (ISOVUE-370) INJECTION 76% COMPARISON:  None. FINDINGS: ASPECTS ([place] Program Early CT Score) from earlier noncontrast head CT: 3 CTA NECK FINDINGS SKELETON: There is no bony spinal canal stenosis. No lytic or blastic lesion. OTHER NECK: Normal pharynx, larynx and major salivary glands. No cervical lymphadenopathy. Unremarkable thyroid gland. UPPER CHEST: No pneumothorax or pleural effusion. No nodules or masses. AORTIC ARCH: There is no calcific atherosclerosis of the aortic arch. There is no aneurysm, dissection or hemodynamically significant stenosis of the visualized ascending aorta and aortic arch. Conventional 3 vessel aortic branching pattern. The visualized proximal subclavian arteries are widely patent. RIGHT CAROTID SYSTEM: --Common carotid artery: Widely patent origin without common carotid artery dissection or aneurysm. --Internal  carotid artery: No dissection, occlusion or aneurysm. There is hypodense atherosclerosis extending into the proximal ICA, resulting in 70% stenosis. --External carotid artery: No acute abnormality. LEFT CAROTID SYSTEM: --Common carotid artery: There is diffuse narrowing of the left common carotid artery with near complete occlusion proximal to the bifurcation. --Internal carotid artery: There is diffuse narrowing of the internal carotid artery. By NASCET criteria, the stenosis measures less than 50%. --External carotid artery: No acute abnormality. VERTEBRAL ARTERIES: Codominant configuration. Both origins are normal. No dissection, occlusion or flow-limiting stenosis to the vertebrobasilar confluence. CTA HEAD FINDINGS POSTERIOR CIRCULATION: --Basilar artery: Normal. --Posterior cerebral arteries: Normal. The right PCA is predominantly supplied by the posterior communicating artery. --Superior cerebellar arteries:  Normal. --Inferior cerebellar arteries: Normal anterior and posterior inferior cerebellar arteries. ANTERIOR CIRCULATION: --Intracranial internal carotid arteries: Mild narrowing of the skull base left ICA. --Anterior cerebral arteries: Normal --Middle cerebral arteries: Complete occlusion of the left MCA with essentially no collateralization. Normal right MCA. --Posterior communicating arteries: Present bilaterally. VENOUS SINUSES: As permitted by contrast timing, patent. ANATOMIC VARIANTS: None DELAYED PHASE: Not performed. Review of the MIP images confirms the above findings. CT Brain Perfusion Findings: CBF (<30%) Volume: 169m Perfusion (Tmax>6.0s) volume: 2053mMismatch Volume: 11m811mnfarction Location:Left MCA distribution IMPRESSION: 1. Complete occlusion of the left middle cerebral artery with no collateralization. 2. Large core infarct of 194 mL in the left MCA territory. 3. Diffuse narrowing of the left carotid system, with short segment occlusion of the distal left common carotid artery. 4. 70% stenosis of the right internal carotid artery at the bifurcation. 5. ASPECTS is 3. Critical Value/emergent results were called by telephone at the time of interpretation on 12/08/2018 at 9:23 pm to Dr. SUSSamara Snidewho verbally acknowledged these results. Electronically Signed   By: KevUlyses JarredD.   On: 11/22/2018 21:40   Ct Cerebral Perfusion W Contrast  Result Date: 12/11/2018 CLINICAL DATA:  Right-sided weakness and slurred speech EXAM: CT ANGIOGRAPHY HEAD AND NECK CT PERFUSION BRAIN TECHNIQUE: Multidetector CT imaging of the head and neck was performed using the standard protocol during bolus administration of intravenous contrast. Multiplanar CT image reconstructions and MIPs were obtained to evaluate the vascular anatomy. Carotid stenosis measurements (when applicable) are obtained utilizing NASCET criteria, using the distal internal carotid diameter as the denominator. Multiphase CT imaging of the  brain was performed following IV bolus contrast injection. Subsequent parametric perfusion maps were calculated using RAPID software. CONTRAST:  100m42mOVUE-370 IOPAMIDOL (ISOVUE-370) INJECTION 76% COMPARISON:  None. FINDINGS: ASPECTS ([place] Program Early CT Score) from earlier noncontrast head CT: 3 CTA NECK FINDINGS SKELETON: There is no bony spinal canal stenosis. No lytic or blastic lesion. OTHER NECK: Normal pharynx, larynx and major salivary glands. No cervical lymphadenopathy. Unremarkable thyroid gland. UPPER CHEST: No pneumothorax or pleural effusion. No nodules or masses. AORTIC ARCH: There is no calcific atherosclerosis of the aortic arch. There is no aneurysm, dissection or hemodynamically significant stenosis of the visualized ascending aorta and aortic arch. Conventional 3 vessel aortic branching pattern. The visualized proximal subclavian arteries are widely patent. RIGHT CAROTID SYSTEM: --Common carotid artery: Widely patent origin without common carotid artery dissection or aneurysm. --Internal carotid artery: No dissection, occlusion or aneurysm. There is hypodense atherosclerosis extending into the proximal ICA, resulting in 70% stenosis. --External carotid artery: No acute abnormality. LEFT CAROTID SYSTEM: --Common carotid artery: There is diffuse narrowing of the left common carotid artery with near complete occlusion proximal to the bifurcation. --Internal carotid artery: There  is diffuse narrowing of the internal carotid artery. By NASCET criteria, the stenosis measures less than 50%. --External carotid artery: No acute abnormality. VERTEBRAL ARTERIES: Codominant configuration. Both origins are normal. No dissection, occlusion or flow-limiting stenosis to the vertebrobasilar confluence. CTA HEAD FINDINGS POSTERIOR CIRCULATION: --Basilar artery: Normal. --Posterior cerebral arteries: Normal. The right PCA is predominantly supplied by the posterior communicating artery. --Superior  cerebellar arteries: Normal. --Inferior cerebellar arteries: Normal anterior and posterior inferior cerebellar arteries. ANTERIOR CIRCULATION: --Intracranial internal carotid arteries: Mild narrowing of the skull base left ICA. --Anterior cerebral arteries: Normal --Middle cerebral arteries: Complete occlusion of the left MCA with essentially no collateralization. Normal right MCA. --Posterior communicating arteries: Present bilaterally. VENOUS SINUSES: As permitted by contrast timing, patent. ANATOMIC VARIANTS: None DELAYED PHASE: Not performed. Review of the MIP images confirms the above findings. CT Brain Perfusion Findings: CBF (<30%) Volume: 175m Perfusion (Tmax>6.0s) volume: 2085mMismatch Volume: 29m77mnfarction Location:Left MCA distribution IMPRESSION: 1. Complete occlusion of the left middle cerebral artery with no collateralization. 2. Large core infarct of 194 mL in the left MCA territory. 3. Diffuse narrowing of the left carotid system, with short segment occlusion of the distal left common carotid artery. 4. 70% stenosis of the right internal carotid artery at the bifurcation. 5. ASPECTS is 3. Critical Value/emergent results were called by telephone at the time of interpretation on 11/28/2018 at 9:23 pm to Dr. SUSSamara Snidewho verbally acknowledged these results. Electronically Signed   By: KevUlyses JarredD.   On: 11/23/2018 21:40   Dg Chest Portable 1 View  Result Date: 12/03/2018 CLINICAL DATA:  Stroke EXAM: PORTABLE CHEST 1 VIEW COMPARISON:  01/09/2011 FINDINGS: The heart size and mediastinal contours are within normal limits. Both lungs are clear. The visualized skeletal structures are unremarkable. IMPRESSION: No active disease. Electronically Signed   By: KimDonavan FoilD.   On: 12/10/2018 22:13   Ct Head Code Stroke Wo Contrast  Result Date: 12/12/2018 CLINICAL DATA:  Code stroke. Right-sided weakness with vomiting and slurred speech EXAM: CT HEAD WITHOUT CONTRAST TECHNIQUE:  Contiguous axial images were obtained from the base of the skull through the vertex without intravenous contrast. COMPARISON:  None. FINDINGS: Brain: There is no mass, hemorrhage or extra-axial collection. The size and configuration of the ventricles and extra-axial CSF spaces are normal. There is a large area acute ischemia within the left hemisphere, within the left MCA territory. Vascular: Hyperdense left MCA. Skull: The visualized skull base, calvarium and extracranial soft tissues are normal. Sinuses/Orbits: No fluid levels or advanced mucosal thickening of the visualized paranasal sinuses. No mastoid or middle ear effusion. The orbits are normal. ASPECTS ([facility] Program Early CT Score) - Ganglionic level infarction (caudate, lentiform nuclei, internal capsule, insula, M1-M3 cortex): 3 - Supraganglionic infarction (M4-M6 cortex): 0 Total score (0-10 with 10 being normal): 3 IMPRESSION: 1. No acute hemorrhage. 2. Large area of acute ischemia within the left MCA territory with hyperdense left MCA. 3. ASPECTS is 3. * These results were communicated to Dr. SusKarena Addisonoor at 8:58 pm on 11/22/2018 by text page via the [facility]. Electronically Signed   By: KevUlyses JarredD.   On: 12/07/2018 20:59    Procedures Procedures (including critical care time)  Medications Ordered in ED Medications  sodium chloride flush (NS) 0.9 % injection 3 mL (3 mLs Intravenous Not Given 11/25/2018 2140)   stroke: mapping our early stages of recovery book (has no administration in time range)  0.9 %  sodium  chloride infusion ( Intravenous New Bag/Given 12/17/2018 2305)  acetaminophen (TYLENOL) tablet 650 mg (has no administration in time range)    Or  acetaminophen (TYLENOL) solution 650 mg (has no administration in time range)    Or  acetaminophen (TYLENOL) suppository 650 mg (has no administration in time range)  senna-docusate (Senokot-S) tablet 1 tablet (has no administration in time range)  iopamidol  (ISOVUE-370) 76 % injection 100 mL (100 mLs Intravenous Contrast Given 12/22/2018 2111)     Initial Impression / Assessment and Plan / ED Course  I have reviewed the triage vital signs and the nursing notes.  Pertinent labs & imaging results that were available during my care of the patient were reviewed by me and considered in my medical decision making (see chart for details).     Patient with acute stroke.  Not a TPA candidate due to time of onset and size of the infarct.  Seen upon arrival by myself and Dr. Lorraine Lax.  Will admit to ICU under neurology service.  CRITICAL CARE Performed by: Davonna Belling Total critical care time: 30 minutes Critical care time was exclusive of separately billable procedures and treating other patients. Critical care was necessary to treat or prevent imminent or life-threatening deterioration. Critical care was time spent personally by me on the following activities: development of treatment plan with patient and/or surrogate as well as nursing, discussions with consultants, evaluation of patient's response to treatment, examination of patient, obtaining history from patient or surrogate, ordering and performing treatments and interventions, ordering and review of laboratory studies, ordering and review of radiographic studies, pulse oximetry and re-evaluation of patient's condition.    Final Clinical Impressions(s) / ED Diagnoses   Final diagnoses:  Acute ischemic stroke [facility])    ED Discharge Orders    None       Davonna Belling, MD 12/18/2018 2326

## 2018-12-13 NOTE — ED Notes (Signed)
Dr. Lorraine Lax paged to notify him of family presence per request.

## 2018-12-13 NOTE — ED Triage Notes (Signed)
Pt arrived [place] from home initially called out for breathing difficulty (hx of lung cancer) Upon evaluation pt was unable to move his right arm, had right leg weakness, right sided facial droop, and trouble speaking. VS with EMS BP 152/80 IV 18G LAC, 18G R hand. LSW 1600

## 2018-12-13 NOTE — Progress Notes (Signed)
Chaplain was paged for prayer support for family and Pt with a newly diagnosis of a stroke. Pt is alert but needs to be approached on his left side. Chaplain prayed for Pt healing according to the will of God. Pt has all girls and 2 grandsons. Follow up visit   12/14/2018 2300  Clinical Encounter Type  Visited With Patient and family together  Visit Type Initial  Referral From Nurse  Spiritual Encounters  Spiritual Needs Prayer;Emotional   by chaplain is recommended.

## 2018-12-14 ENCOUNTER — Inpatient Hospital Stay (HOSPITAL_COMMUNITY): Payer: Medicare Other

## 2018-12-14 ENCOUNTER — Inpatient Hospital Stay: Payer: Self-pay

## 2018-12-14 ENCOUNTER — Encounter (HOSPITAL_COMMUNITY): Payer: Self-pay

## 2018-12-14 DIAGNOSIS — I6789 Other cerebrovascular disease: Secondary | ICD-10-CM

## 2018-12-14 LAB — COMPREHENSIVE METABOLIC PANEL
ALBUMIN: 3.6 g/dL (ref 3.5–5.0)
ALT: 19 U/L (ref 0–44)
ALT: 16 U/L (ref 0–44)
AST: 22 U/L (ref 15–41)
AST: 21 U/L (ref 15–41)
Albumin: 3.4 g/dL — ABNORMAL LOW (ref 3.5–5.0)
Alkaline Phosphatase: 79 U/L (ref 38–126)
Alkaline Phosphatase: 78 U/L (ref 38–126)
Anion gap: 7 (ref 5–15)
Anion gap: 9 (ref 5–15)
BUN: 11 mg/dL (ref 6–20)
BUN: 11 mg/dL (ref 6–20)
CHLORIDE: 99 mmol/L (ref 98–111)
CO2: 28 mmol/L (ref 22–32)
CO2: 28 mmol/L (ref 22–32)
CREATININE: 0.83 mg/dL (ref 0.61–1.24)
Calcium: 8.9 mg/dL (ref 8.9–10.3)
Calcium: 8.7 mg/dL — ABNORMAL LOW (ref 8.9–10.3)
Chloride: 98 mmol/L (ref 98–111)
Creatinine, Ser: 0.73 mg/dL (ref 0.61–1.24)
GFR calc Af Amer: >60 mL/min (ref >60)
GFR calc Af Amer: >60 mL/min (ref >60)
GFR calc non Af Amer: >60 mL/min (ref >60)
GFR calc non Af Amer: >60 mL/min (ref >60)
GLUCOSE: 139 mg/dL — AB (ref 70–99)
Glucose, Bld: 121 mg/dL — ABNORMAL HIGH (ref 70–99)
Potassium: 4 mmol/L (ref 3.5–5.1)
Potassium: 4.2 mmol/L (ref 3.5–5.1)
Sodium: 133 mmol/L — ABNORMAL LOW (ref 135–145)
Sodium: 136 mmol/L (ref 135–145)
Total Bilirubin: 0.9 mg/dL (ref 0.3–1.2)
Total Bilirubin: 0.8 mg/dL (ref 0.3–1.2)
Total Protein: 6.9 g/dL (ref 6.5–8.1)
Total Protein: 6.3 g/dL — ABNORMAL LOW (ref 6.5–8.1)

## 2018-12-14 LAB — CBC WITH DIFFERENTIAL/PLATELET
Abs Immature Granulocytes: 0.27 10*3/uL — ABNORMAL HIGH (ref 0.00–0.07)
Basophils Absolute: 0.1 10*3/uL (ref 0.0–0.1)
Basophils Relative: 0 %
Eosinophils Absolute: 0 10*3/uL (ref 0.0–0.5)
Eosinophils Relative: 0 %
HCT: 43.7 % (ref 39.0–52.0)
Hemoglobin: 14.2 g/dL (ref 13.0–17.0)
Immature Granulocytes: 1 %
Lymphocytes Relative: 2 %
Lymphs Abs: 0.8 10*3/uL (ref 0.7–4.0)
MCH: 32.6 pg (ref 26.0–34.0)
MCHC: 32.5 g/dL (ref 30.0–36.0)
MCV: 100.2 fL — ABNORMAL HIGH (ref 80.0–100.0)
MONOS PCT: 6 %
Monocytes Absolute: 1.8 10*3/uL — ABNORMAL HIGH (ref 0.1–1.0)
NEUTROS ABS: 30.4 10*3/uL — AB (ref 1.7–7.7)
Neutrophils Relative %: 91 %
Platelets: 315 10*3/uL (ref 150–400)
RBC: 4.36 MIL/uL (ref 4.22–5.81)
RDW: 12.3 % (ref 11.5–15.5)
WBC: 33.4 10*3/uL — ABNORMAL HIGH (ref 4.0–10.5)
nRBC: 0 % (ref 0.0–0.2)

## 2018-12-14 LAB — CBC
HCT: 46.6 % (ref 39.0–52.0)
Hemoglobin: 15.8 g/dL (ref 13.0–17.0)
MCH: 33.1 pg (ref 26.0–34.0)
MCHC: 33.9 g/dL (ref 30.0–36.0)
MCV: 97.5 fL (ref 80.0–100.0)
NRBC: 0 % (ref 0.0–0.2)
Platelets: 353 10*3/uL (ref 150–400)
RBC: 4.78 MIL/uL (ref 4.22–5.81)
RDW: 12.3 % (ref 11.5–15.5)
WBC: 32.2 10*3/uL — ABNORMAL HIGH (ref 4.0–10.5)

## 2018-12-14 LAB — GLUCOSE, CAPILLARY
GLUCOSE-CAPILLARY: 125 mg/dL — AB (ref 70–99)
GLUCOSE-CAPILLARY: 127 mg/dL — AB (ref 70–99)
GLUCOSE-CAPILLARY: 133 mg/dL — AB (ref 70–99)
GLUCOSE-CAPILLARY: 125 mg/dL — AB (ref 70–99)
GLUCOSE-CAPILLARY: 127 mg/dL — AB (ref 70–99)
Glucose-Capillary: 155 mg/dL — ABNORMAL HIGH (ref 70–99)
Glucose-Capillary: 137 mg/dL — ABNORMAL HIGH (ref 70–99)
Glucose-Capillary: 155 mg/dL — ABNORMAL HIGH (ref 70–99)
Glucose-Capillary: 157 mg/dL — ABNORMAL HIGH (ref 70–99)
Glucose-Capillary: 134 mg/dL — ABNORMAL HIGH (ref 70–99)
Glucose-Capillary: 140 mg/dL — ABNORMAL HIGH (ref 70–99)
Glucose-Capillary: 141 mg/dL — ABNORMAL HIGH (ref 70–99)
Glucose-Capillary: 137 mg/dL — ABNORMAL HIGH (ref 70–99)
Glucose-Capillary: 128 mg/dL — ABNORMAL HIGH (ref 70–99)
Glucose-Capillary: 133 mg/dL — ABNORMAL HIGH (ref 70–99)
Glucose-Capillary: 123 mg/dL — ABNORMAL HIGH (ref 70–99)
Glucose-Capillary: 124 mg/dL — ABNORMAL HIGH (ref 70–99)
Glucose-Capillary: 132 mg/dL — ABNORMAL HIGH (ref 70–99)
Glucose-Capillary: 138 mg/dL — ABNORMAL HIGH (ref 70–99)
Glucose-Capillary: 136 mg/dL — ABNORMAL HIGH (ref 70–99)
Glucose-Capillary: 134 mg/dL — ABNORMAL HIGH (ref 70–99)

## 2018-12-14 LAB — ECHOCARDIOGRAM COMPLETE
Height: 72 in
Weight: 2455.04 oz

## 2018-12-14 LAB — LIPID PANEL
Cholesterol: 212 mg/dL — ABNORMAL HIGH (ref 0–200)
HDL: 50 mg/dL (ref >40)
LDL Cholesterol: 150 mg/dL — ABNORMAL HIGH (ref 0–99)
Total CHOL/HDL Ratio: 4.2 RATIO
Triglycerides: 59 mg/dL (ref <150)
VLDL: 12 mg/dL (ref 0–40)

## 2018-12-14 LAB — GAMMA GT
GGT: 17 U/L (ref 7–50)
GGT: 15 U/L (ref 7–50)

## 2018-12-14 LAB — PHOSPHORUS
Phosphorus: 4 mg/dL (ref 2.5–4.6)
Phosphorus: 2.9 mg/dL (ref 2.5–4.6)

## 2018-12-14 LAB — RETICULOCYTES
Immature Retic Fract: 9.4 % (ref 2.3–15.9)
RBC.: 4.36 MIL/uL (ref 4.22–5.81)
Retic Count, Absolute: 58.9 10*3/uL (ref 19.0–186.0)
Retic Ct Pct: 1.4 % (ref 0.4–3.1)

## 2018-12-14 LAB — MRSA PCR SCREENING: MRSA by PCR: NEGATIVE

## 2018-12-14 LAB — SODIUM
Sodium: 136 mmol/L (ref 135–145)
Sodium: 136 mmol/L (ref 135–145)

## 2018-12-14 LAB — HEMOGLOBIN A1C
Hgb A1c MFr Bld: 6.1 % — ABNORMAL HIGH (ref 4.8–5.6)
Mean Plasma Glucose: 128.37 mg/dL

## 2018-12-14 LAB — URIC ACID
Uric Acid, Serum: 5.9 mg/dL (ref 3.7–8.6)
Uric Acid, Serum: 5 mg/dL (ref 3.7–8.6)

## 2018-12-14 LAB — HIV ANTIBODY (ROUTINE TESTING W REFLEX): HIV Screen 4th Generation wRfx: NONREACTIVE

## 2018-12-14 MED ORDER — SODIUM CHLORIDE 0.9% FLUSH: 10.0000 mL | INTRAVENOUS | Status: DC | PRN

## 2018-12-14 MED ORDER — SODIUM CHLORIDE 0.9% FLUSH
10.0000 mL | Freq: Two times a day (BID) | INTRAVENOUS | Status: DC
  Administered 2018-12-14 – 2018-12-23 (×17): 10 mL

## 2018-12-14 MED ORDER — STUDY - SODIUM CHLORIDE 0.9% IV SOLN
20.0000 mL/h | INTRAVENOUS | Status: AC
  Administered 2018-12-14: 20 mL/h via INTRAVENOUS
  Filled 2018-12-14: qty 5.2

## 2018-12-14 MED ORDER — STUDY - SODIUM CHLORIDE 0.9% IV SOLN
20.0000 mL/h | INTRAVENOUS | Status: DC
  Administered 2018-12-15: 20 mL/h via INTRAVENOUS
  Filled 2018-12-14: qty 5.2

## 2018-12-14 MED ORDER — CHLORHEXIDINE GLUCONATE CLOTH 2 % EX PADS
6.0000 | MEDICATED_PAD | Freq: Every day | CUTANEOUS | Status: DC
  Administered 2018-12-14 – 2018-12-16 (×3): 6 via TOPICAL

## 2018-12-14 MED ORDER — SODIUM CHLORIDE 3 % IV SOLN
INTRAVENOUS | Status: DC
  Administered 2018-12-14: 75 mL/h via INTRAVENOUS
  Filled 2018-12-14 (×4): qty 500

## 2018-12-14 MED ORDER — STUDY - SODIUM CHLORIDE 0.9% IV SOLN
29.0000 mL/h | INTRAVENOUS | Status: AC
  Administered 2018-12-14: 29 mL/h via INTRAVENOUS
  Filled 2018-12-14: qty 5.2

## 2018-12-14 MED ORDER — STUDY - CHARM - BOLUS VIA INFUSION OF GLIBENCLAMIDE/GLYBURIDE (BIIB093) OR PLACEBO (PI-JINDONG XU)
676.0000 mL/h | Freq: Once | INTRAVENOUS | Status: AC
  Administered 2018-12-14: 3.8329 mg/h via INTRAVENOUS
  Filled 2018-12-14: qty 22.5

## 2018-12-14 MED ORDER — STUDY - SODIUM CHLORIDE 0.9% IV SOLN
20.0000 mL/h | INTRAVENOUS | Status: DC
  Administered 2018-12-16: 20 mL/h via INTRAVENOUS
  Filled 2018-12-14 (×3): qty 5.2

## 2018-12-14 MED ORDER — ONDANSETRON HCL 4 MG/2ML IJ SOLN
4.0000 mg | Freq: Four times a day (QID) | INTRAMUSCULAR | Status: DC | PRN
  Administered 2018-12-14 (×2): 4 mg via INTRAVENOUS
  Filled 2018-12-14 (×3): qty 2

## 2018-12-14 NOTE — Progress Notes (Signed)
  Echocardiogram 2D Echocardiogram has been performed.  Joshua Beck 12/14/2018, 11:19 AM

## 2018-12-14 NOTE — Progress Notes (Signed)
PT Cancellation Note  Patient Details Name: Joshua Beck MRN: [id number] DOB: 07/17/1966   Cancelled Treatment:    Reason Eval/Treat Not Completed: Patient at procedure or test/unavailable.  Currently getting PICC placed.  Will see 1/24 as able. 12/14/2018  Donnella Sham, [facility] [phone]  (pager) [phone]  (office)   [name] 12/14/2018, 4:17 PM

## 2018-12-14 NOTE — Progress Notes (Signed)
Peripherally Inserted Central Catheter/Midline Placement  The IV Nurse has discussed with the patient and/or persons authorized to consent for the patient, the purpose of this procedure and the potential benefits and risks involved with this procedure.  The benefits include less needle sticks, lab draws from the catheter, and the patient may be discharged home with the catheter. Risks include, but not limited to, infection, bleeding, blood clot (thrombus formation), and puncture of an artery; nerve damage and irregular heartbeat and possibility to perform a PICC exchange if needed/ordered by physician.  Alternatives to this procedure were also discussed.  Bard Power PICC patient education guide, fact sheet on infection prevention and patient information card has been provided to patient /or left at bedside.  Consent via telephone from daughter due to altered mental status.  PICC inserted by Claretha Cooper, RN/Melissa Fabio Neighbors, RN  PICC/Midline Placement Documentation  PICC Double Lumen 12/14/18 PICC Left Brachial 45 cm 0 cm (Active)  Indication for Insertion or Continuance of Line Administration of hyperosmolar/irritating solutions (i.e. TPN, Vancomycin, etc.) 12/14/2018  4:30 PM  Exposed Catheter (cm) 0 cm 12/14/2018  4:30 PM  Site Assessment Clean;Dry;Intact 12/14/2018  4:30 PM  Lumen #1 Status Flushed;Saline locked;Blood return noted 12/14/2018  4:30 PM  Lumen #2 Status Flushed;Saline locked;Blood return noted 12/14/2018  4:30 PM  Dressing Type Transparent 12/14/2018  4:30 PM  Dressing Status Clean;Dry;Intact 12/14/2018  4:30 PM  Dressing Intervention New dressing 12/14/2018  4:30 PM  Dressing Change Due 12/21/18 12/14/2018  4:30 PM       Erminio Nygard, Nicolette Bang 12/14/2018, 4:31 PM

## 2018-12-14 NOTE — Progress Notes (Signed)
I have reviewed the patient's MRI which shows progressive cytotoxic edema with now 3 mm left to right midline shift. I spoke to the patient's daughter Earnest Bailey over the phone and explained the patient's poor prognosis. Continue hypertonic saline and will consult neurosurgery for hemicraniectomy. I have clearly explained to the daughter that surgery will only reduce chances of mortality but will not necessarily cause significant neurological improvement and patient is likely going to be permanently disabled with poor quality of life with outcome at best survival in the nursing home with 24-hour care. She voiced understanding. Discussed with Dr. Marlon Pel, MD Medical Director [place] Pager: [phone] 12/14/2018 3:54 PM

## 2018-12-14 NOTE — Evaluation (Signed)
Speech Language Pathology Evaluation Patient Details Name: Joshua Beck MRN: [id number] DOB: 05-09-1966 Today's Date: 12/14/2018 Time: 9678-9381 SLP Time Calculation (min) (ACUTE ONLY): 14 min  Problem List:  Patient Active Problem List   Diagnosis Date Noted  . Acute ischemic left MCA stroke ([place]) 12/08/2018   Past Medical History:  Past Medical History:  Diagnosis Date  . Lung cancer [facility])    Past Surgical History: History reviewed. No pertinent surgical history. HPI:  Pt is a 53 y.o. 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 He presented to the emergency room secondary to his daughter noticing him not speaking. The MR of the brain of 12/14/18 revealed the following: Large confluent evolving acute ischemic left MCA territory infarct. Associated extensive cytotoxic edema with developing regional mass effect and new 3 mm left-to-right midline shift. Associated scattered petechial hemorrhage within the area of infarction without frank hemorrhagic transformation. Evidence for occlusive thrombus throughout the left middle cerebral artery, stable from prior CTA. Abnormal flow void within the left ICA to the cavernous segment may reflect slow flow and/or occlusion. Few additional subcentimeter ischemic infarcts within the right cerebral hemisphere, likely embolic. Pt was not a candidate for TPA since it was outside the 4.5-hour window and he was a candidate for intervention due to already large infarct.   Assessment / Plan / Recommendation Clinical Impression  Pt participated in speech/language evaluation but demonstrated a reduced vocal intensity which made assessment of motor speech skills difficult. His daughter was present and she denied the pt having any baseline speech/language impairments. Pt presents with moderate to severe aphasia characterized by  impairments in both auditory comprehension and verbal expression but with slighty more significant difficulty with verbal expression. He was able to inconsistently follow 1-step commands and consistently answered simple yes/no questions but he demonstrated increased difficulty with auditory comprehension as complexity increased. Expressively, the pt produced "yes", "no", and frequently stated, "I don't know" in response to questions but perseveration was often demonstrated. Skilled SLP services are clinically indicated at this time for aphasia intervention.     SLP Assessment  SLP Visit Diagnosis: Dysphagia, oropharyngeal phase (R13.12)    Follow Up Recommendations       Frequency and Duration min 2x/week  2 weeks      SLP Evaluation Cognition  Overall Cognitive Status: (Unable to reliably assess due to aphasia)       Comprehension  Auditory Comprehension Overall Auditory Comprehension: Impaired Yes/No Questions: Impaired Paragraph Comprehension (via yes/no questions): (25% accuracy) Other Yes/No Questions Comments`: (Simple: 100% accuracy; Complex: 20% accuracy) Commands: Impaired One Step Basic Commands: (50% accuracy) Two Step Basic Commands: (0% accuracy) Conversation: Simple EffectiveTechniques: Extra processing time;Slowed speech;Stressing words;Repetition Reading Comprehension Reading Status: Not tested    Expression Expression Primary Mode of Expression: Verbal Verbal Expression Overall Verbal Expression: Impaired Initiation: Impaired Automatic Speech: (Counting 5/10; Day: 0/7) Level of Generative/Spontaneous Verbalization: Phrase Repetition: Impaired Level of Impairment: Word level;Phrase level Naming: Impairment Responsive: (0% accuracy) Confrontation: Impaired Convergent: (0% accuracy) Divergent: Not tested Other Verbal Expression Comments: Pt often expressed "I dont' know" in response to questions   Oral / Motor  Oral Motor/Sensory Function Overall Oral  Motor/Sensory Function: Moderate impairment Facial ROM: Reduced right;Suspected CN VII (facial) dysfunction Motor Speech Overall Motor Speech: Impaired Respiration: Within functional limits Phonation: Low vocal intensity    Vasili Fok I. Hardin Negus, [place], [place] Office number  [place] 12/14/2018, 6:41 PM

## 2018-12-14 NOTE — Plan of Care (Signed)
Pt came in with large left MCA infarct. He is CHARM trial candidate, inclusion and exclusion criteria screened. Discussed with daughter and she was interested and consent form signed. Pt was then randomized after screening blood tests resulted. Pt will give trial meds soon and start glucose monitoring.   Rosalin Hawking, MD PhD Stroke Neurology 12/14/2018 12:32 AM

## 2018-12-14 NOTE — Progress Notes (Signed)
Patient had no urine output since being admitted onto unit.  Bladder scan showed 884mL.  Paged on call neuro MD and received orders to in and out catheter patient.  Procedure preformed successfully and acquired 950Ml of urine.

## 2018-12-14 NOTE — Progress Notes (Signed)
Spoke with Merrily Pew, RN regarding PICC order.  Patient has 3% NS ordered at 75 cc/hr.  Patient cannot sign and there is no contact information in the computer.  Merrily Pew states that daughter is to arrive today around 6pm but he will attempt to get contact information so that consent can be obtained via telephone.  Instructed Josh to administer 3% NS via PIV until PICC can be placed.  Carolee Rota, RN [facility]

## 2018-12-14 NOTE — Consult Note (Signed)
Reason for Consult: ? left hemicraniectomy for large left MCA stroke Referring Physician: Dr. Antony Contras  Joshua Beck is an 53 y.o. white male.  HPI: Patient brought to the [facility] emergency room by EMS for suspected stroke.  He developed aphasia and right-sided weakness yesterday afternoon.  Patient admitted to the stroke service for treatment and care.  His nurse reports that he was somewhat drowsier earlier today, but he has been started on an experimental protocol (see Dr. Clydene Fake note) as well as hypertonic saline, and his nurse reports that he is now less drowsy.  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  The younger of his 2 daughters is at the bedside.  Consulted by Dr. Leonie Man regarding the question of left hemicraniectomy.  Past Medical History:  Past Medical History:  Diagnosis Date  . Lung cancer [facility])     Past Surgical History: Not obtainable due to aphasia  Family History: Not obtainable due to aphasia.  Social History: Patient has a long smoking history, and reportedly continues to smoke 1 pack/day.  Allergies: No Known Allergies  Medications: I have reviewed the patient's current medications.  ROS : Not obtainable due to aphasia.  Physical Examination: Well-developed well-nourished white male in no acute distress. Blood pressure 134/89, pulse 99, temperature 98.6 F (37 C), temperature source Axillary, resp. rate 18, height 6' (1.829 m), weight 69.6 kg, SpO2 96 %. Lungs: Clear to auscultation, symmetrical respiratory excursion. Heart: Regular rate and rhythm, no murmur. Abdomen: Soft, nondistended, bowel sounds present. Extremity: Clubbing, no edema  Neurological Examination: Mental Status Examination: Opening eyes spontaneously and to voice.  Follows simple commands such  as close eyes and protrude tongue, but not hold up 2 fingers.  Occasional very limited speech: "yeah". Cranial Nerve Examination: Pupils 3 mm, round, reactive to light.  Left gaze tendency.  Right facial weakness. Motor Examination: Good strength in left upper and lower extremity with purposeful movement.  Some movement of right lower extremity, no movement of right upper extremity. Sensory Examination: Senses pinprick. Reflex Examination:   Diminished. Gait and Stance Examination: Not tested due to the nature of the patient's condition.   Results for orders placed or performed during the hospital encounter of 12/09/2018 (from the past 48 hour(s))  Protime-INR     Status: None   Collection Time: 12/18/2018  8:43 PM  Result Value Ref Range   Prothrombin Time 12.6 11.4 - 15.2 seconds   INR 0.95     Comment: Performed at [facility], [place] [address], [place], [place] [address]  APTT     Status: None   Collection Time: 11/29/2018  8:43 PM  Result Value Ref Range   aPTT 29 24 - 36 seconds    Comment: Performed at [facility] [address] [place], [place] [address]  CBC     Status: Abnormal   Collection Time: 12/06/2018  8:43 PM  Result Value Ref Range   WBC 23.0 (H) 4.0 - 10.5 K/uL   RBC 4.90 4.22 - 5.81 MIL/uL   Hemoglobin 15.9 13.0 - 17.0 g/dL   HCT 49.3 39.0 - 52.0 %   MCV 100.6 (H) 80.0 - 100.0 fL   MCH 32.4 26.0 - 34.0 pg   MCHC 32.3 30.0 - 36.0 g/dL   RDW 12.2 11.5 - 15.5 %   Platelets 348 150 -  400 K/uL   nRBC 0.0 0.0 - 0.2 %    Comment: Performed at [facility], [place] [address] [place], [place] [address]  Differential     Status: Abnormal   Collection Time: 12/06/2018  8:43 PM  Result Value Ref Range   Neutrophils Relative % 94 %   Neutro Abs 21.7 (H) 1.7 - 7.7 K/uL   Lymphocytes Relative 3 %   Lymphs Abs 0.6 (L) 0.7 - 4.0 K/uL   Monocytes Relative 2 %   Monocytes Absolute 0.5 0.1 - 1.0 K/uL   Eosinophils Relative 0 %   Eosinophils Absolute 0.0 0.0 - 0.5  K/uL   Basophils Relative 0 %   Basophils Absolute 0.1 0.0 - 0.1 K/uL   Immature Granulocytes 1 %   Abs Immature Granulocytes 0.16 (H) 0.00 - 0.07 K/uL    Comment: Performed at [facility] [address], [place], [place] [address]  Comprehensive metabolic panel     Status: Abnormal   Collection Time: 11/22/2018  8:43 PM  Result Value Ref Range   Sodium 134 (L) 135 - 145 mmol/L   Potassium 4.6 3.5 - 5.1 mmol/L   Chloride 97 (L) 98 - 111 mmol/L   CO2 24 22 - 32 mmol/L   Glucose, Bld 165 (H) 70 - 99 mg/dL   BUN 11 6 - 20 mg/dL   Creatinine, Ser 1.05 0.61 - 1.24 mg/dL   Calcium 9.3 8.9 - 10.3 mg/dL   Total Protein 7.5 6.5 - 8.1 g/dL   Albumin 4.1 3.5 - 5.0 g/dL   AST 24 15 - 41 U/L   ALT 20 0 - 44 U/L   Alkaline Phosphatase 87 38 - 126 U/L   Total Bilirubin 0.8 0.3 - 1.2 mg/dL   GFR calc non Af Amer >60 >60 mL/min   GFR calc Af Amer >60 >60 mL/min   Anion gap 13 5 - 15    Comment: Performed at [facility], [place] [address] [place], [place] [address]  CBG monitoring, ED     Status: Abnormal   Collection Time: 12/02/2018  8:45 PM  Result Value Ref Range   Glucose-Capillary 156 (H) 70 - 99 mg/dL  I-stat troponin, ED     Status: None   Collection Time: 12/18/2018  8:51 PM  Result Value Ref Range   Troponin i, poc 0.00 0.00 - 0.08 ng/mL   Comment 3            Comment: Due to the release kinetics of cTnI, a negative result within the first hours of the onset of symptoms does not rule out myocardial infarction with certainty. If myocardial infarction is still suspected, repeat the test at appropriate intervals.   I-Stat Creatinine, ED (do not order at [facility])     Status: None   Collection Time: 11/26/2018  8:52 PM  Result Value Ref Range   Creatinine, Ser 1.10 0.61 - 1.24 mg/dL  MRSA PCR Screening     Status: None   Collection Time: 11/24/2018 10:45 PM  Result Value Ref Range   MRSA by PCR NEGATIVE NEGATIVE    Comment:        The GeneXpert MRSA Assay (FDA approved for  NASAL specimens only), is one component of a comprehensive MRSA colonization surveillance program. It is not intended to diagnose MRSA infection nor to guide or monitor treatment for MRSA infections. Performed at [facility], [place] [address], [place], [place] [address]   Glucose,  capillary     Status: Abnormal   Collection Time: 12/10/2018 10:52 PM  Result Value Ref Range   Glucose-Capillary 131 (H) 70 - 99 mg/dL  HIV antibody (Routine Testing)     Status: None   Collection Time: 12/17/2018 11:11 PM  Result Value Ref Range   HIV Screen 4th Generation wRfx Non Reactive Non Reactive    Comment: (NOTE) Performed At: [facility] [place], [place] [id number] Rush Farmer MD [id number]   Gamma GT     Status: None   Collection Time: 12/12/2018 11:11 PM  Result Value Ref Range   GGT 17 7 - 50 U/L    Comment: Performed at [facility], [place] [address] [place], [place] [address]  Phosphorus     Status: None   Collection Time: 12/14/2018 11:11 PM  Result Value Ref Range   Phosphorus 4.0 2.5 - 4.6 mg/dL    Comment: Performed at [facility] [address] [place], [place] [address]  Reticulocytes     Status: None   Collection Time: 11/27/2018 11:11 PM  Result Value Ref Range   Retic Ct Pct 1.4 0.4 - 3.1 %   RBC. 4.87 4.22 - 5.81 MIL/uL   Retic Count, Absolute 65.7 19.0 - 186.0 K/uL   Immature Retic Fract 6.2 2.3 - 15.9 %    Comment: Performed at [facility] [address], [place], [place] [address]  Uric acid     Status: None   Collection Time: 12/11/2018 11:11 PM  Result Value Ref Range   Uric Acid, Serum 5.9 3.7 - 8.6 mg/dL    Comment: Performed at [facility] [address], [place], [place] [address]  Hemoglobin A1c     Status: Abnormal   Collection Time: 12/14/18 12:04 AM  Result Value Ref Range   Hgb A1c MFr Bld 6.1 (H) 4.8 - 5.6 %    Comment: (NOTE) Pre diabetes:          5.7%-6.4% Diabetes:               >6.4% Glycemic control for   <7.0% adults with diabetes    Mean Plasma Glucose 128.37 mg/dL    Comment: Performed at [facility] [address] [place], [place] [address]  Lipid panel     Status: Abnormal   Collection Time: 12/14/18 12:04 AM  Result Value Ref Range   Cholesterol 212 (H) 0 - 200 mg/dL   Triglycerides 59 <150 mg/dL   HDL 50 >40 mg/dL   Total CHOL/HDL Ratio 4.2 RATIO   VLDL 12 0 - 40 mg/dL   LDL Cholesterol 150 (H) 0 - 99 mg/dL    Comment:        Total Cholesterol/HDL:CHD Risk Coronary Heart Disease Risk Table                     Men   Women  1/2 Average Risk   3.4   3.3  Average Risk       5.0   4.4  2 X Average Risk   9.6   7.1  3 X Average Risk  23.4   11.0        Use the calculated Patient Ratio above and the CHD Risk Table to determine the patient's CHD Risk.        ATP III CLASSIFICATION (LDL):  <100     mg/dL   Optimal  100-129  mg/dL  Near or Above                    Optimal  130-159  mg/dL   Borderline  160-189  mg/dL   High  >190     mg/dL   Very High Performed at [facility] [address] [place], [place] [address]   Glucose, capillary     Status: Abnormal   Collection Time: 12/14/18  2:00 AM  Result Value Ref Range   Glucose-Capillary 155 (H) 70 - 99 mg/dL  Glucose, capillary     Status: Abnormal   Collection Time: 12/14/18  2:58 AM  Result Value Ref Range   Glucose-Capillary 137 (H) 70 - 99 mg/dL  Glucose, capillary     Status: Abnormal   Collection Time: 12/14/18  3:50 AM  Result Value Ref Range   Glucose-Capillary 155 (H) 70 - 99 mg/dL  Glucose, capillary     Status: Abnormal   Collection Time: 12/14/18  4:55 AM  Result Value Ref Range   Glucose-Capillary 157 (H) 70 - 99 mg/dL  CBC     Status: Abnormal   Collection Time: 12/14/18  5:11 AM  Result Value Ref Range   WBC 32.2 (H) 4.0 - 10.5 K/uL   RBC 4.78 4.22 - 5.81 MIL/uL   Hemoglobin 15.8 13.0 - 17.0 g/dL   HCT 46.6 39.0 - 52.0 %   MCV 97.5 80.0 - 100.0 fL    MCH 33.1 26.0 - 34.0 pg   MCHC 33.9 30.0 - 36.0 g/dL   RDW 12.3 11.5 - 15.5 %   Platelets 353 150 - 400 K/uL   nRBC 0.0 0.0 - 0.2 %    Comment: Performed at [facility], [place]. [address], [place],  [address]  Comprehensive metabolic panel     Status: Abnormal   Collection Time: 12/14/18  5:11 AM  Result Value Ref Range   Sodium 133 (L) 135 - 145 mmol/L   Potassium 4.0 3.5 - 5.1 mmol/L   Chloride 98 98 - 111 mmol/L   CO2 28 22 - 32 mmol/L   Glucose, Bld 139 (H) 70 - 99 mg/dL   BUN 11 6 - 20 mg/dL   Creatinine, Ser 0.73 0.61 - 1.24 mg/dL   Calcium 8.9 8.9 - 10.3 mg/dL   Total Protein 6.9 6.5 - 8.1 g/dL   Albumin 3.6 3.5 - 5.0 g/dL   AST 22 15 - 41 U/L   ALT 19 0 - 44 U/L   Alkaline Phosphatase 79 38 - 126 U/L   Total Bilirubin 0.9 0.3 - 1.2 mg/dL   GFR calc non Af Amer >60 >60 mL/min   GFR calc Af Amer >60 >60 mL/min   Anion gap 7 5 - 15    Comment: Performed at [facility] [address], [place], [place] [address]  Glucose, capillary     Status: Abnormal   Collection Time: 12/14/18  6:00 AM  Result Value Ref Range   Glucose-Capillary 134 (H) 70 - 99 mg/dL  Glucose, capillary     Status: Abnormal   Collection Time: 12/14/18  6:57 AM  Result Value Ref Range   Glucose-Capillary 140 (H) 70 - 99 mg/dL  Glucose, capillary     Status: Abnormal   Collection Time: 12/14/18  8:01 AM  Result Value Ref Range   Glucose-Capillary 141 (H) 70 - 99 mg/dL   Comment 1 Notify RN    Comment 2 Document in Chart  Glucose, capillary     Status: Abnormal   Collection Time: 12/14/18  9:04 AM  Result Value Ref Range   Glucose-Capillary 137 (H) 70 - 99 mg/dL  Sodium     Status: None   Collection Time: 12/14/18  9:43 AM  Result Value Ref Range   Sodium 136 135 - 145 mmol/L    Comment: Performed at [facility], [place] [address] [place], [place] [address]  Glucose, capillary     Status: Abnormal   Collection Time: 12/14/18 10:22 AM  Result Value Ref Range    Glucose-Capillary 125 (H) 70 - 99 mg/dL  Glucose, capillary     Status: Abnormal   Collection Time: 12/14/18 11:22 AM  Result Value Ref Range   Glucose-Capillary 127 (H) 70 - 99 mg/dL  Glucose, capillary     Status: Abnormal   Collection Time: 12/14/18 12:01 PM  Result Value Ref Range   Glucose-Capillary 128 (H) 70 - 99 mg/dL   Comment 1 Notify RN    Comment 2 Document in Chart   Glucose, capillary     Status: Abnormal   Collection Time: 12/14/18  1:15 PM  Result Value Ref Range   Glucose-Capillary 133 (H) 70 - 99 mg/dL  Glucose, capillary     Status: Abnormal   Collection Time: 12/14/18  2:38 PM  Result Value Ref Range   Glucose-Capillary 123 (H) 70 - 99 mg/dL  Sodium     Status: None   Collection Time: 12/14/18  2:51 PM  Result Value Ref Range   Sodium 136 135 - 145 mmol/L    Comment: Performed at [facility], [place]. [address] [place], [place] [address]  Glucose, capillary     Status: Abnormal   Collection Time: 12/14/18  3:00 PM  Result Value Ref Range   Glucose-Capillary 133 (H) 70 - 99 mg/dL  CBC with Differential/Platelet     Status: Abnormal   Collection Time: 12/14/18  4:43 PM  Result Value Ref Range   WBC 33.4 (H) 4.0 - 10.5 K/uL   RBC 4.36 4.22 - 5.81 MIL/uL   Hemoglobin 14.2 13.0 - 17.0 g/dL   HCT 43.7 39.0 - 52.0 %   MCV 100.2 (H) 80.0 - 100.0 fL   MCH 32.6 26.0 - 34.0 pg   MCHC 32.5 30.0 - 36.0 g/dL   RDW 12.3 11.5 - 15.5 %   Platelets 315 150 - 400 K/uL   nRBC 0.0 0.0 - 0.2 %   Neutrophils Relative % 91 %   Neutro Abs 30.4 (H) 1.7 - 7.7 K/uL   Lymphocytes Relative 2 %   Lymphs Abs 0.8 0.7 - 4.0 K/uL   Monocytes Relative 6 %   Monocytes Absolute 1.8 (H) 0.1 - 1.0 K/uL   Eosinophils Relative 0 %   Eosinophils Absolute 0.0 0.0 - 0.5 K/uL   Basophils Relative 0 %   Basophils Absolute 0.1 0.0 - 0.1 K/uL   Immature Granulocytes 1 %   Abs Immature Granulocytes 0.27 (H) 0.00 - 0.07 K/uL    Comment: Performed at [facility] [address],  [place], [place] [address]  Comprehensive metabolic panel     Status: Abnormal (Preliminary result)   Collection Time: 12/14/18  4:43 PM  Result Value Ref Range   Sodium 136 135 - 145 mmol/L   Potassium 4.2 3.5 - 5.1 mmol/L   Chloride 99 98 - 111 mmol/L   CO2 28 22 - 32 mmol/L   Glucose, Bld  121 (H) 70 - 99 mg/dL   BUN 11 6 - 20 mg/dL   Creatinine, Ser 0.83 0.61 - 1.24 mg/dL   Calcium 8.7 (L) 8.9 - 10.3 mg/dL   Total Protein 6.3 (L) 6.5 - 8.1 g/dL   Albumin 3.4 (L) 3.5 - 5.0 g/dL   AST 21 15 - 41 U/L   ALT 16 0 - 44 U/L   Alkaline Phosphatase 78 38 - 126 U/L   Total Bilirubin PENDING 0.3 - 1.2 mg/dL   GFR calc non Af Amer >60 >60 mL/min   GFR calc Af Amer >60 >60 mL/min   Anion gap 9 5 - 15    Comment: Performed at [facility] [address], [place], [place] [address]  Gamma GT     Status: None   Collection Time: 12/14/18  4:43 PM  Result Value Ref Range   GGT 15 7 - 50 U/L    Comment: Performed at [facility], [place] [address], [place], [place] [address]  Phosphorus     Status: None   Collection Time: 12/14/18  4:43 PM  Result Value Ref Range   Phosphorus 2.9 2.5 - 4.6 mg/dL    Comment: Performed at [facility] [address] [place], [place] [address]  Uric acid     Status: None   Collection Time: 12/14/18  4:43 PM  Result Value Ref Range   Uric Acid, Serum 5.0 3.7 - 8.6 mg/dL    Comment: Performed at [facility] [address], [place], [place] [address]  Reticulocytes     Status: None   Collection Time: 12/14/18  4:43 PM  Result Value Ref Range   Retic Ct Pct 1.4 0.4 - 3.1 %   RBC. 4.36 4.22 - 5.81 MIL/uL   Retic Count, Absolute 58.9 19.0 - 186.0 K/uL   Immature Retic Fract 9.4 2.3 - 15.9 %    Comment: Performed at [facility] [address], [place], [place] [address]  Glucose, capillary     Status: Abnormal   Collection Time: 12/14/18  4:58 PM  Result Value Ref Range   Glucose-Capillary 124 (H) 70 - 99 mg/dL    Ct Angio Head W  Or Wo Contrast  Result Date: 12/02/2018 CLINICAL DATA:  Right-sided weakness and slurred speech EXAM: CT ANGIOGRAPHY HEAD AND NECK CT PERFUSION BRAIN TECHNIQUE: Multidetector CT imaging of the head and neck was performed using the standard protocol during bolus administration of intravenous contrast. Multiplanar CT image reconstructions and MIPs were obtained to evaluate the vascular anatomy. Carotid stenosis measurements (when applicable) are obtained utilizing NASCET criteria, using the distal internal carotid diameter as the denominator. Multiphase CT imaging of the brain was performed following IV bolus contrast injection. Subsequent parametric perfusion maps were calculated using RAPID software. CONTRAST:  167mL ISOVUE-370 IOPAMIDOL (ISOVUE-370) INJECTION 76% COMPARISON:  None. FINDINGS: ASPECTS ([place] Stroke Program Early CT Score) from earlier noncontrast head CT: 3 CTA NECK FINDINGS SKELETON: There is no bony spinal canal stenosis. No lytic or blastic lesion. OTHER NECK: Normal pharynx, larynx and major salivary glands. No cervical lymphadenopathy. Unremarkable thyroid gland. UPPER CHEST: No pneumothorax or pleural effusion. No nodules or masses. AORTIC ARCH: There is no calcific atherosclerosis of the aortic arch. There is no aneurysm, dissection or hemodynamically significant stenosis of the visualized ascending aorta and aortic arch. Conventional 3 vessel aortic branching pattern. The visualized proximal subclavian arteries are widely patent. RIGHT CAROTID SYSTEM: --Common carotid artery:  Widely patent origin without common carotid artery dissection or aneurysm. --Internal carotid artery: No dissection, occlusion or aneurysm. There is hypodense atherosclerosis extending into the proximal ICA, resulting in 70% stenosis. --External carotid artery: No acute abnormality. LEFT CAROTID SYSTEM: --Common carotid artery: There is diffuse narrowing of the left common carotid artery with near complete occlusion  proximal to the bifurcation. --Internal carotid artery: There is diffuse narrowing of the internal carotid artery. By NASCET criteria, the stenosis measures less than 50%. --External carotid artery: No acute abnormality. VERTEBRAL ARTERIES: Codominant configuration. Both origins are normal. No dissection, occlusion or flow-limiting stenosis to the vertebrobasilar confluence. CTA HEAD FINDINGS POSTERIOR CIRCULATION: --Basilar artery: Normal. --Posterior cerebral arteries: Normal. The right PCA is predominantly supplied by the posterior communicating artery. --Superior cerebellar arteries: Normal. --Inferior cerebellar arteries: Normal anterior and posterior inferior cerebellar arteries. ANTERIOR CIRCULATION: --Intracranial internal carotid arteries: Mild narrowing of the skull base left ICA. --Anterior cerebral arteries: Normal --Middle cerebral arteries: Complete occlusion of the left MCA with essentially no collateralization. Normal right MCA. --Posterior communicating arteries: Present bilaterally. VENOUS SINUSES: As permitted by contrast timing, patent. ANATOMIC VARIANTS: None DELAYED PHASE: Not performed. Review of the MIP images confirms the above findings. CT Brain Perfusion Findings: CBF (<30%) Volume: 178mL Perfusion (Tmax>6.0s) volume: 249mL Mismatch Volume: 3mL Infarction Location:Left MCA distribution IMPRESSION: 1. Complete occlusion of the left middle cerebral artery with no collateralization. 2. Large core infarct of 194 mL in the left MCA territory. 3. Diffuse narrowing of the left carotid system, with short segment occlusion of the distal left common carotid artery. 4. 70% stenosis of the right internal carotid artery at the bifurcation. 5. ASPECTS is 3. Critical Value/emergent results were called by telephone at the time of interpretation on 12/18/2018 at 9:23 pm to Dr. Samara Snide , who verbally acknowledged these results. Electronically Signed   By: Ulyses Jarred M.D.   On: 12/09/2018 21:40    Ct Head Wo Contrast  Result Date: 12/14/2018 CLINICAL DATA:  Follow up stroke, LEFT middle cerebral artery occlusion. EXAM: CT HEAD WITHOUT CONTRAST TECHNIQUE: Contiguous axial images were obtained from the base of the skull through the vertex without intravenous contrast. COMPARISON:  CT HEAD December 13, 2018 FINDINGS: BRAIN: Confluent cytotoxic edema LEFT frontotemporal parietal lobes and new LEFT basal ganglia propagation. No hemorrhagic conversion. Regional mass effect without midline shift. No hydrocephalus or parenchymal brain volume loss for age. No abnormal extra-axial fluid collections. Basal cisterns are patent. VASCULAR: Dense LEFT MCA consistent with thromboembolism. SKULL/SOFT TISSUES: No skull fracture. No significant soft tissue swelling. ORBITS/SINUSES: The included ocular globes and orbital contents are normal.Chronic maxillary sinusitis. Mastoid air cells are well aerated. OTHER: None. IMPRESSION: 1. Evolving acute large LEFT MCA territory infarct propagated to LEFT basal ganglia. No hemorrhagic conversion. Electronically Signed   By: Elon Alas M.D.   On: 12/14/2018 04:25   Ct Angio Neck W And/or Wo Contrast  Result Date: 12/19/2018 CLINICAL DATA:  Right-sided weakness and slurred speech EXAM: CT ANGIOGRAPHY HEAD AND NECK CT PERFUSION BRAIN TECHNIQUE: Multidetector CT imaging of the head and neck was performed using the standard protocol during bolus administration of intravenous contrast. Multiplanar CT image reconstructions and MIPs were obtained to evaluate the vascular anatomy. Carotid stenosis measurements (when applicable) are obtained utilizing NASCET criteria, using the distal internal carotid diameter as the denominator. Multiphase CT imaging of the brain was performed following IV bolus contrast injection. Subsequent parametric perfusion maps were calculated using RAPID software. CONTRAST:  191mL ISOVUE-370 IOPAMIDOL (ISOVUE-370)  INJECTION 76% COMPARISON:  None.  FINDINGS: ASPECTS ([place] Stroke Program Early CT Score) from earlier noncontrast head CT: 3 CTA NECK FINDINGS SKELETON: There is no bony spinal canal stenosis. No lytic or blastic lesion. OTHER NECK: Normal pharynx, larynx and major salivary glands. No cervical lymphadenopathy. Unremarkable thyroid gland. UPPER CHEST: No pneumothorax or pleural effusion. No nodules or masses. AORTIC ARCH: There is no calcific atherosclerosis of the aortic arch. There is no aneurysm, dissection or hemodynamically significant stenosis of the visualized ascending aorta and aortic arch. Conventional 3 vessel aortic branching pattern. The visualized proximal subclavian arteries are widely patent. RIGHT CAROTID SYSTEM: --Common carotid artery: Widely patent origin without common carotid artery dissection or aneurysm. --Internal carotid artery: No dissection, occlusion or aneurysm. There is hypodense atherosclerosis extending into the proximal ICA, resulting in 70% stenosis. --External carotid artery: No acute abnormality. LEFT CAROTID SYSTEM: --Common carotid artery: There is diffuse narrowing of the left common carotid artery with near complete occlusion proximal to the bifurcation. --Internal carotid artery: There is diffuse narrowing of the internal carotid artery. By NASCET criteria, the stenosis measures less than 50%. --External carotid artery: No acute abnormality. VERTEBRAL ARTERIES: Codominant configuration. Both origins are normal. No dissection, occlusion or flow-limiting stenosis to the vertebrobasilar confluence. CTA HEAD FINDINGS POSTERIOR CIRCULATION: --Basilar artery: Normal. --Posterior cerebral arteries: Normal. The right PCA is predominantly supplied by the posterior communicating artery. --Superior cerebellar arteries: Normal. --Inferior cerebellar arteries: Normal anterior and posterior inferior cerebellar arteries. ANTERIOR CIRCULATION: --Intracranial internal carotid arteries: Mild narrowing of the skull base left  ICA. --Anterior cerebral arteries: Normal --Middle cerebral arteries: Complete occlusion of the left MCA with essentially no collateralization. Normal right MCA. --Posterior communicating arteries: Present bilaterally. VENOUS SINUSES: As permitted by contrast timing, patent. ANATOMIC VARIANTS: None DELAYED PHASE: Not performed. Review of the MIP images confirms the above findings. CT Brain Perfusion Findings: CBF (<30%) Volume: 181mL Perfusion (Tmax>6.0s) volume: 273mL Mismatch Volume: 77mL Infarction Location:Left MCA distribution IMPRESSION: 1. Complete occlusion of the left middle cerebral artery with no collateralization. 2. Large core infarct of 194 mL in the left MCA territory. 3. Diffuse narrowing of the left carotid system, with short segment occlusion of the distal left common carotid artery. 4. 70% stenosis of the right internal carotid artery at the bifurcation. 5. ASPECTS is 3. Critical Value/emergent results were called by telephone at the time of interpretation on 12/10/2018 at 9:23 pm to Dr. Samara Snide , who verbally acknowledged these results. Electronically Signed   By: Ulyses Jarred M.D.   On: 12/08/2018 21:40   Mr Brain Wo Contrast  Result Date: 12/14/2018 CLINICAL DATA:  Follow-up examination for acute left MCA territory stroke. EXAM: MRI HEAD WITHOUT CONTRAST TECHNIQUE: Multiplanar, multiecho pulse sequences of the brain and surrounding structures were obtained without intravenous contrast. COMPARISON:  Prior CT and CTA from 12/13/2017 FINDINGS: Brain: Examination mildly degraded by motion artifact. Large confluent area of restricted diffusion involving nearly the entirety of the left MCA territory seen, compatible with evolving acute left MCA territory infarct. This closely resembles previously seen core infarct on prior CT perfusion. Involvement of the left basal ganglia. Cytotoxic edema throughout the area of infarction with developing regional mass effect. Left lateral ventricle  partially effaced. Early left-to-right midline shift measuring up to 3 mm now evident. No hydrocephalus or ventricular trapping at this time. Basilar cisterns remain patent. Scattered areas of petechial hemorrhage within the area of infarction without frank hemorrhagic transformation. Few additional punctate acute ischemic infarcts seen involving the  right cerebral hemisphere (series 5, image 83, 75, 66). No associated hemorrhage. Findings are likely embolic. Underlying mild chronic small vessel ischemic disease. Remote lacunar infarct noted within the right cerebral hemispheric white matter. 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 No mass lesion. No extra-axial fluid collection. Vascular: Extensive susceptibility artifact throughout the left M1 segment and proximal left MCA branches, compatible with occlusive thrombus. Abnormal flow void seen within the left ICA to the cavernous segment, which could be related to slow flow and/or occlusion (series 9, image 4). Left ICA was major intracranial vascular flow voids otherwise maintained. Patent on prior CTA. Skull and upper cervical spine: Craniocervical junction within normal limits. Degenerative spondylolysis noted at C4-5, partially visualized. Remainder the visualized upper cervical spine otherwise unremarkable. Bone marrow signal intensity within normal limits. No scalp soft tissue abnormality. Sinuses/Orbits: Globes and orbital soft tissues demonstrate no acute finding. Scattered mucosal thickening throughout the paranasal sinuses. Superimposed right maxillary sinus retention cyst. Small bilateral mastoid effusions, left greater than right. Inner ear structures grossly normal. Other: None. IMPRESSION: 1. Large confluent evolving acute ischemic left MCA territory infarct. Associated extensive cytotoxic edema with developing regional mass effect and new 3 mm left-to-right midline shift. No hydrocephalus or ventricular  trapping. Associated scattered petechial hemorrhage within the area of infarction without frank hemorrhagic transformation. 2. Evidence for occlusive thrombus throughout the left middle cerebral artery, stable from prior CTA. Abnormal flow void within the left ICA to the cavernous segment may reflect slow flow and/or occlusion. 3. Few additional subcentimeter ischemic infarcts within the right cerebral hemisphere as above, likely embolic. Electronically Signed   By: Jeannine Boga M.D.   On: 12/14/2018 15:14   Ct Cerebral Perfusion W Contrast  Result Date: 11/27/2018 CLINICAL DATA:  Right-sided weakness and slurred speech EXAM: CT ANGIOGRAPHY HEAD AND NECK CT PERFUSION BRAIN TECHNIQUE: Multidetector CT imaging of the head and neck was performed using the standard protocol during bolus administration of intravenous contrast. Multiplanar CT image reconstructions and MIPs were obtained to evaluate the vascular anatomy. Carotid stenosis measurements (when applicable) are obtained utilizing NASCET criteria, using the distal internal carotid diameter as the denominator. Multiphase CT imaging of the brain was performed following IV bolus contrast injection. Subsequent parametric perfusion maps were calculated using RAPID software. CONTRAST:  113mL ISOVUE-370 IOPAMIDOL (ISOVUE-370) INJECTION 76% COMPARISON:  None. FINDINGS: ASPECTS ([facility] Stroke Program Early CT Score) from earlier noncontrast head CT: 3 CTA NECK FINDINGS SKELETON: There is no bony spinal canal stenosis. No lytic or blastic lesion. OTHER NECK: Normal pharynx, larynx and major salivary glands. No cervical lymphadenopathy. Unremarkable thyroid gland. UPPER CHEST: No pneumothorax or pleural effusion. No nodules or masses. AORTIC ARCH: There is no calcific atherosclerosis of the aortic arch. There is no aneurysm, dissection or hemodynamically significant stenosis of the visualized ascending aorta and aortic arch. Conventional 3 vessel aortic  branching pattern. The visualized proximal subclavian arteries are widely patent. RIGHT CAROTID SYSTEM: --Common carotid artery: Widely patent origin without common carotid artery dissection or aneurysm. --Internal carotid artery: No dissection, occlusion or aneurysm. There is hypodense atherosclerosis extending into the proximal ICA, resulting in 70% stenosis. --External carotid artery: No acute abnormality. LEFT CAROTID SYSTEM: --Common carotid artery: There is diffuse narrowing of the left common carotid artery with near complete occlusion proximal to the bifurcation. --Internal carotid artery: There is diffuse narrowing of the internal carotid artery. By NASCET criteria, the stenosis measures less than 50%. --External carotid artery: No acute abnormality. VERTEBRAL ARTERIES:  Codominant configuration. Both origins are normal. No dissection, occlusion or flow-limiting stenosis to the vertebrobasilar confluence. CTA HEAD FINDINGS POSTERIOR CIRCULATION: --Basilar artery: Normal. --Posterior cerebral arteries: Normal. The right PCA is predominantly supplied by the posterior communicating artery. --Superior cerebellar arteries: Normal. --Inferior cerebellar arteries: Normal anterior and posterior inferior cerebellar arteries. ANTERIOR CIRCULATION: --Intracranial internal carotid arteries: Mild narrowing of the skull base left ICA. --Anterior cerebral arteries: Normal --Middle cerebral arteries: Complete occlusion of the left MCA with essentially no collateralization. Normal right MCA. --Posterior communicating arteries: Present bilaterally. VENOUS SINUSES: As permitted by contrast timing, patent. ANATOMIC VARIANTS: None DELAYED PHASE: Not performed. Review of the MIP images confirms the above findings. CT Brain Perfusion Findings: CBF (<30%) Volume: 135mL Perfusion (Tmax>6.0s) volume: 235mL Mismatch Volume: 32mL Infarction Location:Left MCA distribution IMPRESSION: 1. Complete occlusion of the left middle cerebral  artery with no collateralization. 2. Large core infarct of 194 mL in the left MCA territory. 3. Diffuse narrowing of the left carotid system, with short segment occlusion of the distal left common carotid artery. 4. 70% stenosis of the right internal carotid artery at the bifurcation. 5. ASPECTS is 3. Critical Value/emergent results were called by telephone at the time of interpretation on 12/04/2018 at 9:23 pm to Dr. Samara Snide , who verbally acknowledged these results. Electronically Signed   By: Ulyses Jarred M.D.   On: 12/05/2018 21:40   Dg Chest Portable 1 View  Result Date: 12/12/2018 CLINICAL DATA:  Stroke EXAM: PORTABLE CHEST 1 VIEW COMPARISON:  01/09/2011 FINDINGS: The heart size and mediastinal contours are within normal limits. Both lungs are clear. The visualized skeletal structures are unremarkable. IMPRESSION: No active disease. Electronically Signed   By: Donavan Foil M.D.   On: 12/19/2018 22:13   Ct Head Code Stroke Wo Contrast  Result Date: 12/10/2018 CLINICAL DATA:  Code stroke. Right-sided weakness with vomiting and slurred speech EXAM: CT HEAD WITHOUT CONTRAST TECHNIQUE: Contiguous axial images were obtained from the base of the skull through the vertex without intravenous contrast. COMPARISON:  None. FINDINGS: Brain: There is no mass, hemorrhage or extra-axial collection. The size and configuration of the ventricles and extra-axial CSF spaces are normal. There is a large area acute ischemia within the left hemisphere, within the left MCA territory. Vascular: Hyperdense left MCA. Skull: The visualized skull base, calvarium and extracranial soft tissues are normal. Sinuses/Orbits: No fluid levels or advanced mucosal thickening of the visualized paranasal sinuses. No mastoid or middle ear effusion. The orbits are normal. ASPECTS [facility] Stroke Program Early CT Score) - Ganglionic level infarction (caudate, lentiform nuclei, internal capsule, insula, M1-M3 cortex): 3 - Supraganglionic  infarction (M4-M6 cortex): 0 Total score (0-10 with 10 being normal): 3 IMPRESSION: 1. No acute hemorrhage. 2. Large area of acute ischemia within the left MCA territory with hyperdense left MCA. 3. ASPECTS is 3. * These results were communicated to Dr. Karena Addison Aroor at 8:58 pm on 12/11/2018 by text page via the [facility] messaging system. Electronically Signed   By: Ulyses Jarred M.D.   On: 12/11/2018 20:59   [place] Ekg Site Rite  Result Date: 12/14/2018 If Site Rite image not attached, placement could not be confirmed due to current cardiac rhythm.    Assessment/Plan: Patient with large left MCA infarct undergoing extensive medical therapy by stroke service.  Currently awake with minimal lethargy, but with profound aphasia and right hemiplegia (arm and face worse than leg).  Certainly there is no indication for hemicraniectomy at this time, and if the patient were  to decline I feel the role of hemicraniectomy remains questionable.  Most people would would not want to prolong life with a devastating neurologic deficit with aphasia (inability to communicate with family, friends, and providers) and right hemiplegia (necessitating care for all ADLs).  The only role that hemicraniectomy would provide would be some increase in the likelihood of survival with a devastating neurologic deficit, it would not in any way increase the likelihood of neurologic recovery.  Joshua Spangle, MD 12/14/2018, 5:55 PM

## 2018-12-14 NOTE — Progress Notes (Addendum)
STROKE TEAM PROGRESS NOTE   INTERVAL HISTORY No family at bedside. Patient had episode of emesis this morning. Still with right side neglect and forced gaze deviation to left. Patient following CHARM trial protocol.( IV Glyburide versus placebo ) for cerebral edema Ordered PICC line placement and hypertonic saline infusion initiation.   Vitals:   12/14/18 0600 12/14/18 0700 12/14/18 0800 12/14/18 0900  BP: 137/80 130/87 136/80 131/71  Pulse: 99 96 98 93  Resp: (!) 24 16 20 18   Temp:   98.8 F (37.1 C)   TempSrc:   Oral   SpO2: 98% 96% 97% 95%  Weight:      Height:        CBC:  Recent Labs  Lab 12/22/2018 2043 12/14/18 0511  WBC 23.0* 32.2*  NEUTROABS 21.7*  --   HGB 15.9 15.8  HCT 49.3 46.6  MCV 100.6* 97.5  PLT 348 962    Basic Metabolic Panel:  Recent Labs  Lab 12/04/2018 2043 12/15/2018 2052 12/19/2018 2311 12/14/18 0511  NA 134*  --   --  133*  K 4.6  --   --  4.0  CL 97*  --   --  98  CO2 24  --   --  28  GLUCOSE 165*  --   --  139*  BUN 11  --   --  11  CREATININE 1.05 1.10  --  0.73  CALCIUM 9.3  --   --  8.9  PHOS  --   --  4.0  --    Lipid Panel:     Component Value Date/Time   CHOL 212 (H) 12/14/2018 0004   TRIG 59 12/14/2018 0004   HDL 50 12/14/2018 0004   CHOLHDL 4.2 12/14/2018 0004   VLDL 12 12/14/2018 0004   LDLCALC 150 (H) 12/14/2018 0004   HgbA1c:  Lab Results  Component Value Date   HGBA1C 6.1 (H) 12/14/2018   Urine Drug Screen: No results found for: LABOPIA, COCAINSCRNUR, LABBENZ, AMPHETMU, THCU, LABBARB  Alcohol Level No results found for: ETH  IMAGING Ct Angio Head W Or Wo Contrast  Result Date: 12/09/2018 CLINICAL DATA:  Right-sided weakness and slurred speech EXAM: CT ANGIOGRAPHY HEAD AND NECK CT PERFUSION BRAIN TECHNIQUE: Multidetector CT imaging of the head and neck was performed using the standard protocol during bolus administration of intravenous contrast. Multiplanar CT image reconstructions and MIPs were obtained to evaluate  the vascular anatomy. Carotid stenosis measurements (when applicable) are obtained utilizing NASCET criteria, using the distal internal carotid diameter as the denominator. Multiphase CT imaging of the brain was performed following IV bolus contrast injection. Subsequent parametric perfusion maps were calculated using RAPID software. CONTRAST:  131mL ISOVUE-370 IOPAMIDOL (ISOVUE-370) INJECTION 76% COMPARISON:  None. FINDINGS: ASPECTS ([place] Stroke Program Early CT Score) from earlier noncontrast head CT: 3 CTA NECK FINDINGS SKELETON: There is no bony spinal canal stenosis. No lytic or blastic lesion. OTHER NECK: Normal pharynx, larynx and major salivary glands. No cervical lymphadenopathy. Unremarkable thyroid gland. UPPER CHEST: No pneumothorax or pleural effusion. No nodules or masses. AORTIC ARCH: There is no calcific atherosclerosis of the aortic arch. There is no aneurysm, dissection or hemodynamically significant stenosis of the visualized ascending aorta and aortic arch. Conventional 3 vessel aortic branching pattern. The visualized proximal subclavian arteries are widely patent. RIGHT CAROTID SYSTEM: --Common carotid artery: Widely patent origin without common carotid artery dissection or aneurysm. --Internal carotid artery: No dissection, occlusion or aneurysm. There is hypodense atherosclerosis extending into the proximal  ICA, resulting in 70% stenosis. --External carotid artery: No acute abnormality. LEFT CAROTID SYSTEM: --Common carotid artery: There is diffuse narrowing of the left common carotid artery with near complete occlusion proximal to the bifurcation. --Internal carotid artery: There is diffuse narrowing of the internal carotid artery. By NASCET criteria, the stenosis measures less than 50%. --External carotid artery: No acute abnormality. VERTEBRAL ARTERIES: Codominant configuration. Both origins are normal. No dissection, occlusion or flow-limiting stenosis to the vertebrobasilar  confluence. CTA HEAD FINDINGS POSTERIOR CIRCULATION: --Basilar artery: Normal. --Posterior cerebral arteries: Normal. The right PCA is predominantly supplied by the posterior communicating artery. --Superior cerebellar arteries: Normal. --Inferior cerebellar arteries: Normal anterior and posterior inferior cerebellar arteries. ANTERIOR CIRCULATION: --Intracranial internal carotid arteries: Mild narrowing of the skull base left ICA. --Anterior cerebral arteries: Normal --Middle cerebral arteries: Complete occlusion of the left MCA with essentially no collateralization. Normal right MCA. --Posterior communicating arteries: Present bilaterally. VENOUS SINUSES: As permitted by contrast timing, patent. ANATOMIC VARIANTS: None DELAYED PHASE: Not performed. Review of the MIP images confirms the above findings. CT Brain Perfusion Findings: CBF (<30%) Volume: 132mL Perfusion (Tmax>6.0s) volume: 29mL Mismatch Volume: 66mL Infarction Location:Left MCA distribution IMPRESSION: 1. Complete occlusion of the left middle cerebral artery with no collateralization. 2. Large core infarct of 194 mL in the left MCA territory. 3. Diffuse narrowing of the left carotid system, with short segment occlusion of the distal left common carotid artery. 4. 70% stenosis of the right internal carotid artery at the bifurcation. 5. ASPECTS is 3. Critical Value/emergent results were called by telephone at the time of interpretation on 12/17/2018 at 9:23 pm to Dr. Samara Snide , who verbally acknowledged these results. Electronically Signed   By: Ulyses Jarred M.D.   On: 12/18/2018 21:40   Ct Head Wo Contrast  Result Date: 12/14/2018 CLINICAL DATA:  Follow up stroke, LEFT middle cerebral artery occlusion. EXAM: CT HEAD WITHOUT CONTRAST TECHNIQUE: Contiguous axial images were obtained from the base of the skull through the vertex without intravenous contrast. COMPARISON:  CT HEAD December 13, 2018 FINDINGS: BRAIN: Confluent cytotoxic edema LEFT  frontotemporal parietal lobes and new LEFT basal ganglia propagation. No hemorrhagic conversion. Regional mass effect without midline shift. No hydrocephalus or parenchymal brain volume loss for age. No abnormal extra-axial fluid collections. Basal cisterns are patent. VASCULAR: Dense LEFT MCA consistent with thromboembolism. SKULL/SOFT TISSUES: No skull fracture. No significant soft tissue swelling. ORBITS/SINUSES: The included ocular globes and orbital contents are normal.Chronic maxillary sinusitis. Mastoid air cells are well aerated. OTHER: None. IMPRESSION: 1. Evolving acute large LEFT MCA territory infarct propagated to LEFT basal ganglia. No hemorrhagic conversion. Electronically Signed   By: Elon Alas M.D.   On: 12/14/2018 04:25   Ct Angio Neck W And/or Wo Contrast  Result Date: 12/05/2018 CLINICAL DATA:  Right-sided weakness and slurred speech EXAM: CT ANGIOGRAPHY HEAD AND NECK CT PERFUSION BRAIN TECHNIQUE: Multidetector CT imaging of the head and neck was performed using the standard protocol during bolus administration of intravenous contrast. Multiplanar CT image reconstructions and MIPs were obtained to evaluate the vascular anatomy. Carotid stenosis measurements (when applicable) are obtained utilizing NASCET criteria, using the distal internal carotid diameter as the denominator. Multiphase CT imaging of the brain was performed following IV bolus contrast injection. Subsequent parametric perfusion maps were calculated using RAPID software. CONTRAST:  192mL ISOVUE-370 IOPAMIDOL (ISOVUE-370) INJECTION 76% COMPARISON:  None. FINDINGS: ASPECTS ([place] Stroke Program Early CT Score) from earlier noncontrast head CT: 3 CTA NECK FINDINGS SKELETON: There is no  bony spinal canal stenosis. No lytic or blastic lesion. OTHER NECK: Normal pharynx, larynx and major salivary glands. No cervical lymphadenopathy. Unremarkable thyroid gland. UPPER CHEST: No pneumothorax or pleural effusion. No nodules or  masses. AORTIC ARCH: There is no calcific atherosclerosis of the aortic arch. There is no aneurysm, dissection or hemodynamically significant stenosis of the visualized ascending aorta and aortic arch. Conventional 3 vessel aortic branching pattern. The visualized proximal subclavian arteries are widely patent. RIGHT CAROTID SYSTEM: --Common carotid artery: Widely patent origin without common carotid artery dissection or aneurysm. --Internal carotid artery: No dissection, occlusion or aneurysm. There is hypodense atherosclerosis extending into the proximal ICA, resulting in 70% stenosis. --External carotid artery: No acute abnormality. LEFT CAROTID SYSTEM: --Common carotid artery: There is diffuse narrowing of the left common carotid artery with near complete occlusion proximal to the bifurcation. --Internal carotid artery: There is diffuse narrowing of the internal carotid artery. By NASCET criteria, the stenosis measures less than 50%. --External carotid artery: No acute abnormality. VERTEBRAL ARTERIES: Codominant configuration. Both origins are normal. No dissection, occlusion or flow-limiting stenosis to the vertebrobasilar confluence. CTA HEAD FINDINGS POSTERIOR CIRCULATION: --Basilar artery: Normal. --Posterior cerebral arteries: Normal. The right PCA is predominantly supplied by the posterior communicating artery. --Superior cerebellar arteries: Normal. --Inferior cerebellar arteries: Normal anterior and posterior inferior cerebellar arteries. ANTERIOR CIRCULATION: --Intracranial internal carotid arteries: Mild narrowing of the skull base left ICA. --Anterior cerebral arteries: Normal --Middle cerebral arteries: Complete occlusion of the left MCA with essentially no collateralization. Normal right MCA. --Posterior communicating arteries: Present bilaterally. VENOUS SINUSES: As permitted by contrast timing, patent. ANATOMIC VARIANTS: None DELAYED PHASE: Not performed. Review of the MIP images confirms the  above findings. CT Brain Perfusion Findings: CBF (<30%) Volume: 163mL Perfusion (Tmax>6.0s) volume: 279mL Mismatch Volume: 29mL Infarction Location:Left MCA distribution IMPRESSION: 1. Complete occlusion of the left middle cerebral artery with no collateralization. 2. Large core infarct of 194 mL in the left MCA territory. 3. Diffuse narrowing of the left carotid system, with short segment occlusion of the distal left common carotid artery. 4. 70% stenosis of the right internal carotid artery at the bifurcation. 5. ASPECTS is 3. Critical Value/emergent results were called by telephone at the time of interpretation on 11/22/2018 at 9:23 pm to Dr. Samara Snide , who verbally acknowledged these results. Electronically Signed   By: Ulyses Jarred M.D.   On: 12/20/2018 21:40   Ct Cerebral Perfusion W Contrast  Result Date: 11/27/2018 CLINICAL DATA:  Right-sided weakness and slurred speech EXAM: CT ANGIOGRAPHY HEAD AND NECK CT PERFUSION BRAIN TECHNIQUE: Multidetector CT imaging of the head and neck was performed using the standard protocol during bolus administration of intravenous contrast. Multiplanar CT image reconstructions and MIPs were obtained to evaluate the vascular anatomy. Carotid stenosis measurements (when applicable) are obtained utilizing NASCET criteria, using the distal internal carotid diameter as the denominator. Multiphase CT imaging of the brain was performed following IV bolus contrast injection. Subsequent parametric perfusion maps were calculated using RAPID software. CONTRAST:  179mL ISOVUE-370 IOPAMIDOL (ISOVUE-370) INJECTION 76% COMPARISON:  None. FINDINGS: ASPECTS ([place] Stroke Program Early CT Score) from earlier noncontrast head CT: 3 CTA NECK FINDINGS SKELETON: There is no bony spinal canal stenosis. No lytic or blastic lesion. OTHER NECK: Normal pharynx, larynx and major salivary glands. No cervical lymphadenopathy. Unremarkable thyroid gland. UPPER CHEST: No pneumothorax or pleural  effusion. No nodules or masses. AORTIC ARCH: There is no calcific atherosclerosis of the aortic arch. There is no aneurysm, dissection or hemodynamically  significant stenosis of the visualized ascending aorta and aortic arch. Conventional 3 vessel aortic branching pattern. The visualized proximal subclavian arteries are widely patent. RIGHT CAROTID SYSTEM: --Common carotid artery: Widely patent origin without common carotid artery dissection or aneurysm. --Internal carotid artery: No dissection, occlusion or aneurysm. There is hypodense atherosclerosis extending into the proximal ICA, resulting in 70% stenosis. --External carotid artery: No acute abnormality. LEFT CAROTID SYSTEM: --Common carotid artery: There is diffuse narrowing of the left common carotid artery with near complete occlusion proximal to the bifurcation. --Internal carotid artery: There is diffuse narrowing of the internal carotid artery. By NASCET criteria, the stenosis measures less than 50%. --External carotid artery: No acute abnormality. VERTEBRAL ARTERIES: Codominant configuration. Both origins are normal. No dissection, occlusion or flow-limiting stenosis to the vertebrobasilar confluence. CTA HEAD FINDINGS POSTERIOR CIRCULATION: --Basilar artery: Normal. --Posterior cerebral arteries: Normal. The right PCA is predominantly supplied by the posterior communicating artery. --Superior cerebellar arteries: Normal. --Inferior cerebellar arteries: Normal anterior and posterior inferior cerebellar arteries. ANTERIOR CIRCULATION: --Intracranial internal carotid arteries: Mild narrowing of the skull base left ICA. --Anterior cerebral arteries: Normal --Middle cerebral arteries: Complete occlusion of the left MCA with essentially no collateralization. Normal right MCA. --Posterior communicating arteries: Present bilaterally. VENOUS SINUSES: As permitted by contrast timing, patent. ANATOMIC VARIANTS: None DELAYED PHASE: Not performed. Review of the MIP  images confirms the above findings. CT Brain Perfusion Findings: CBF (<30%) Volume: 177mL Perfusion (Tmax>6.0s) volume: 214mL Mismatch Volume: 82mL Infarction Location:Left MCA distribution IMPRESSION: 1. Complete occlusion of the left middle cerebral artery with no collateralization. 2. Large core infarct of 194 mL in the left MCA territory. 3. Diffuse narrowing of the left carotid system, with short segment occlusion of the distal left common carotid artery. 4. 70% stenosis of the right internal carotid artery at the bifurcation. 5. ASPECTS is 3. Critical Value/emergent results were called by telephone at the time of interpretation on 12/04/2018 at 9:23 pm to Dr. Samara Snide , who verbally acknowledged these results. Electronically Signed   By: Ulyses Jarred M.D.   On: 12/10/2018 21:40   Dg Chest Portable 1 View  Result Date: 12/18/2018 CLINICAL DATA:  Stroke EXAM: PORTABLE CHEST 1 VIEW COMPARISON:  01/09/2011 FINDINGS: The heart size and mediastinal contours are within normal limits. Both lungs are clear. The visualized skeletal structures are unremarkable. IMPRESSION: No active disease. Electronically Signed   By: Donavan Foil M.D.   On: 12/08/2018 22:13   Ct Head Code Stroke Wo Contrast  Result Date: 12/09/2018 CLINICAL DATA:  Code stroke. Right-sided weakness with vomiting and slurred speech EXAM: CT HEAD WITHOUT CONTRAST TECHNIQUE: Contiguous axial images were obtained from the base of the skull through the vertex without intravenous contrast. COMPARISON:  None. FINDINGS: Brain: There is no mass, hemorrhage or extra-axial collection. The size and configuration of the ventricles and extra-axial CSF spaces are normal. There is a large area acute ischemia within the left hemisphere, within the left MCA territory. Vascular: Hyperdense left MCA. Skull: The visualized skull base, calvarium and extracranial soft tissues are normal. Sinuses/Orbits: No fluid levels or advanced mucosal thickening of the  visualized paranasal sinuses. No mastoid or middle ear effusion. The orbits are normal. ASPECTS [facility] Stroke Program Early CT Score) - Ganglionic level infarction (caudate, lentiform nuclei, internal capsule, insula, M1-M3 cortex): 3 - Supraganglionic infarction (M4-M6 cortex): 0 Total score (0-10 with 10 being normal): 3 IMPRESSION: 1. No acute hemorrhage. 2. Large area of acute ischemia within the left MCA territory with hyperdense  left MCA. 3. ASPECTS is 3. * These results were communicated to Dr. Karena Addison Aroor at 8:58 pm on 12/03/2018 by text page via the [facility] messaging system. Electronically Signed   By: Ulyses Jarred M.D.   On: 12/06/2018 20:59    PHYSICAL EXAM  73 Caucasian male not in distress. . Afebrile. Head is nontraumatic. Neck is supple without bruit.    Cardiac exam no murmur or gallop. Lungs are clear to auscultation. Distal pulses are well felt.  Neurological Exam :  Patient is drowsy but does open eyes. He is globally aphasic and mute. Does not follow any commands. He has head deviation to the left. Left gaze preference. Unable to look to the right. Blinks to threat on the left but not on the right. Pupils equal 3 mm reactive. Fundi not visualized. Right lower facial weakness. Tongue midline. Dense right hemiplegia with flaccidity and hypotonia. Trace withdrawal to painful stimuli in the leg but none in the right upper extremity. Purposeful antigravity strength on the left side. ASSESSMENT/PLAN Joshua Beck is a 53 y.o. 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 Not a candidate for TPA d/t presenting outside of TPA window. Not a candidate for IR given core of 194 ml.   Stroke:  left MCA infarct embolic   CT head no acute hemorrhage; large area of acute ischemia in the left MCA territory  with hyperdense left MCA. Aspects 3.  CTA head & neck :Complete occlusion of the left middle cerebral artery with no collateralization. Large core infarct of 194 mL in the left MCA territory. Diffuse narrowing of the left carotid system, with short segment occlusion of the distal left common carotid artery.70% stenosis of the right internal carotid artery at the bifurcation. ASPECTS is 3.  CT perfusion: Large core infarct of 194 mL in the left MCA territory.  MRI  pending  2D Echo  pending  LDL 150  HgbA1c 6.1  SCD's for VTE prophylaxis Diet Order            Diet NPO time specified  Diet effective now               aspirin 81 mg daily prior to admission, now on No antithrombotic.   Therapy recommendations:  pending  Disposition:  pending  Hypertension  Stable . Permissive hypertension (OK if < 220/120) but gradually normalize in 5-7 days . Long-term BP goal normotensive  Hyperlipidemia  Home meds:  none,   LDL 150, goal < 70  Add atorvastatin 80 mg daily ; start when patient able to take PO  Continue statin at discharge  Diabetes type II  HgbA1c 6.1, goal < 7.0  Controlled  Other Stroke Risk Factors  Cigarette smoker advised to stop smoking  Hx stroke  Other Active Problems  Cytotoxic cerebral edema; CHARM trial enrollment; Stasrt hypertonic saline  Consider Neuro surgery consult; if repeat CT head shows midline shift.  Avoid hypotonic fluids Hospital day # 1  Laurey Morale, MSN, NP-C [facility] [phone] I have personally obtained history,examined this patient, reviewed notes, independently viewed imaging studies, participated in medical decision making and plan of care.ROS completed by me personally and pertinent positives fully documented  I have made any additions or clarifications directly to the above note. Agree with note above. He presented with  aphasia and right hemiplegia with-stroke scale of 24 with a large ischemic  or of 194 mL on CT perfusion without any significant salvageable tissue. He was too late for thrombolysis and was not a suitable candidate for mechanical thrombectomy. He remains at risk for significant cytotoxic edema and brain herniation and death. Dr. Erlinda Hong spoke to the patient`s daughter and enrolled him in the CHARM trial of of IV glyburide versus placebo for cerebral edema.recommend start hypertonic saline with serum sodium goal 1 50-1 55. PICC line placement. Check MRI scan later today. He may need emergent intubation and hemicraniectomy is cerebral edema progresses. Discussed with daughter at the bedside and answered questions. Discussed with Dr. Erlinda Hong. And critical care  PA-c This patient is critically ill and at significant risk of neurological worsening, death and care requires constant monitoring of vital signs, hemodynamics,respiratory and cardiac monitoring, extensive review of multiple databases, frequent neurological assessment, discussion with family, other specialists and medical decision making of high complexity.I have made any additions or clarifications directly to the above note.This critical care time does not reflect procedure time, or teaching time or supervisory time of PA/NP/Med Resident etc but could involve care discussion time.  I spent 50 minutes of neurocritical care time  in the care of  this patient.     Antony Contras, MD Medical Director [facility] Pager: [phone] 12/14/2018 2:34 PM   To contact Stroke Continuity provider, please refer to [email or web address] After hours, contact General Neurology

## 2018-12-14 NOTE — Progress Notes (Signed)
OT Cancellation Note  Patient Details Name: Joshua Beck MRN: [id number] DOB: 1966/06/09   Cancelled Treatment:    Reason Eval/Treat Not Completed: Patient at procedure or test/ unavailable (currently getting PICC placed). Will follow up for OT eval as schedule permits.  Lou Cal, OT [facility] Pager [phone] Office [phone]   Raymondo Band 12/14/2018, 3:47 PM

## 2018-12-14 NOTE — Evaluation (Signed)
Clinical/Bedside Swallow Evaluation Patient Details  Name: Joshua Beck MRN: [id number] Date of Birth: 03-Sep-1966  Today's Date: 12/14/2018 Time: SLP Start Time (ACUTE ONLY): 9371 SLP Stop Time (ACUTE ONLY): 1713 SLP Time Calculation (min) (ACUTE ONLY): 23 min  Past Medical History:  Past Medical History:  Diagnosis Date  . Lung cancer [facility])    Past Surgical History: History reviewed. No pertinent surgical history. HPI:  Pt is a 53 y.o. 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 He presented to the emergency room secondary to his daughter noticing him not speaking. The MR of the brain of 12/14/18 revealed the following: Large confluent evolving acute ischemic left MCA territory infarct. Associated extensive cytotoxic edema with developing regional mass effect and new 3 mm left-to-right midline shift. Associated scattered petechial hemorrhage within the area of infarction without frank hemorrhagic transformation. Evidence for occlusive thrombus throughout the left middle cerebral artery, stable from prior CTA. Abnormal flow void within the left ICA to the cavernous segment may reflect slow flow and/or occlusion. Few additional subcentimeter ischemic infarcts within the right cerebral hemisphere, likely embolic. Pt was not a candidate for TPA since it was outside the 4.5-hour window and he was a candidate for intervention due to already large infarct.   Assessment / Plan / Recommendation Clinical Impression  Pt was seen for bedside swallow evaluation. He was alert throughout the session and his daughter was present. He was unable to follow all the necessary commands for completion of a full oral mechanism exam. Pt presents with symptoms of oropharyngeal dysphagia including anterior spillage, oral reisdue with puree, multiple swallows with tsp-size boluses,  and signs of aspiration with thin, and nectar thick liquids via tsp. Pt tolerated puree solids and honey thick liquids without overt s/sx of aspiration. However, pharyngeal residue is suspected due to the presence of multiple swallows. Pt does have a history of iatrogenic dysphagia s/p oropharyngeal cancer with radiation in 2008 and needed a PEG tube at that time. Pt's daughter denied the pt's receipt of dysphagia therapy and indicated and he has not been demonstrating any recent signs of aspiration. It is recommended that the pt's NPO status be maintained and that a modified barium swallow study be conducted to further assess the functional integrity of the swallow mechanism.  SLP Visit Diagnosis: Dysphagia, oropharyngeal phase (R13.12)    Aspiration Risk  Severe aspiration risk    Diet Recommendation NPO   Medication Administration: Via alternative means    Other  Recommendations Oral Care Recommendations: Oral care QID   Follow up Recommendations        Frequency and Duration min 2x/week  2 weeks       Prognosis Prognosis for Safe Diet Advancement: Fair Barriers to Reach Goals: Severity of deficits;Other (Comment)(Language impairment)      Swallow Study   General HPI: Pt is a 53 y.o. 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 He presented to the emergency room secondary to his daughter noticing him not speaking. The MR of the brain of 12/14/18 revealed the following: Large confluent evolving acute ischemic left MCA territory infarct. Associated extensive cytotoxic edema with developing regional mass effect and new 3 mm left-to-right midline shift. Associated scattered petechial hemorrhage within the area of infarction without frank hemorrhagic transformation. Evidence for occlusive  thrombus throughout the left middle cerebral artery, stable from  prior CTA. Abnormal flow void within the left ICA to the cavernous segment may reflect slow flow and/or occlusion. Few additional subcentimeter ischemic infarcts within the right cerebral hemisphere, likely embolic. Pt was not a candidate for TPA since it was outside the 4.5-hour window and he was a candidate for intervention due to already large infarct. Type of Study: Bedside Swallow Evaluation Previous Swallow Assessment: None Diet Prior to this Study: NPO Temperature Spikes Noted: No Respiratory Status: Nasal cannula History of Recent Intubation: No Behavior/Cognition: Alert;Cooperative;Doesn't follow directions Oral Cavity Assessment: Within Functional Limits Oral Care Completed by SLP: No Oral Cavity - Dentition: Adequate natural dentition Patient Positioning: Upright in bed;Postural control adequate for testing Baseline Vocal Quality: Normal Volitional Cough: Strong Volitional Swallow: Able to elicit    Oral/Motor/Sensory Function Overall Oral Motor/Sensory Function: Moderate impairment Facial ROM: Reduced right;Suspected CN VII (facial) dysfunction   Ice Chips Ice chips: Within functional limits Presentation: Spoon   Thin Liquid Thin Liquid: Impaired Presentation: Spoon Oral Phase Impairments: Reduced labial seal Oral Phase Functional Implications: Right anterior spillage Pharyngeal  Phase Impairments: Suspected delayed Swallow;Cough - Immediate;Multiple swallows    Nectar Thick Nectar Thick Liquid: Impaired Presentation: Spoon Oral Phase Impairments: Reduced labial seal Pharyngeal Phase Impairments: Suspected delayed Swallow;Cough - Immediate;Multiple swallows   Honey Thick Honey Thick Liquid: Impaired Presentation: Spoon Oral Phase Impairments: Reduced labial seal Pharyngeal Phase Impairments: Suspected delayed Swallow;Cough - Immediate;Multiple swallows   Puree Puree: Impaired Presentation: Spoon Oral Phase Functional Implications: Oral residue Pharyngeal Phase  Impairments: Multiple swallows   Solid    Maisy Newport I. Hardin Negus, [place], [place] Office number [phone] Pager [phone]  Solid: Not tested      Horton Marshall 12/14/2018,6:24 PM

## 2018-12-15 ENCOUNTER — Inpatient Hospital Stay (HOSPITAL_COMMUNITY): Payer: Medicare Other

## 2018-12-15 DIAGNOSIS — R0689 Other abnormalities of breathing: Secondary | ICD-10-CM

## 2018-12-15 DIAGNOSIS — E876 Hypokalemia: Secondary | ICD-10-CM

## 2018-12-15 DIAGNOSIS — R0902 Hypoxemia: Secondary | ICD-10-CM

## 2018-12-15 DIAGNOSIS — I63512 Cerebral infarction due to unspecified occlusion or stenosis of left middle cerebral artery: Principal | ICD-10-CM

## 2018-12-15 LAB — GLUCOSE, CAPILLARY
GLUCOSE-CAPILLARY: 150 mg/dL — AB (ref 70–99)
GLUCOSE-CAPILLARY: 167 mg/dL — AB (ref 70–99)
Glucose-Capillary: 120 mg/dL — ABNORMAL HIGH (ref 70–99)
Glucose-Capillary: 123 mg/dL — ABNORMAL HIGH (ref 70–99)
Glucose-Capillary: 128 mg/dL — ABNORMAL HIGH (ref 70–99)
Glucose-Capillary: 128 mg/dL — ABNORMAL HIGH (ref 70–99)
Glucose-Capillary: 131 mg/dL — ABNORMAL HIGH (ref 70–99)
Glucose-Capillary: 141 mg/dL — ABNORMAL HIGH (ref 70–99)
Glucose-Capillary: 144 mg/dL — ABNORMAL HIGH (ref 70–99)
Glucose-Capillary: 145 mg/dL — ABNORMAL HIGH (ref 70–99)
Glucose-Capillary: 148 mg/dL — ABNORMAL HIGH (ref 70–99)
Glucose-Capillary: 152 mg/dL — ABNORMAL HIGH (ref 70–99)
Glucose-Capillary: 153 mg/dL — ABNORMAL HIGH (ref 70–99)
Glucose-Capillary: 160 mg/dL — ABNORMAL HIGH (ref 70–99)
Glucose-Capillary: 176 mg/dL — ABNORMAL HIGH (ref 70–99)

## 2018-12-15 LAB — CBC WITH DIFFERENTIAL/PLATELET
BASOS PCT: 0 %
Band Neutrophils: 0 %
Basophils Absolute: 0 10*3/uL (ref 0.0–0.1)
Blasts: 0 %
EOS ABS: 0 10*3/uL (ref 0.0–0.5)
EOS PCT: 0 %
HCT: 38.5 % — ABNORMAL LOW (ref 39.0–52.0)
Hemoglobin: 12.3 g/dL — ABNORMAL LOW (ref 13.0–17.0)
Lymphocytes Relative: 3 %
Lymphs Abs: 0.9 10*3/uL (ref 0.7–4.0)
MCH: 32.4 pg (ref 26.0–34.0)
MCHC: 31.9 g/dL (ref 30.0–36.0)
MCV: 101.3 fL — ABNORMAL HIGH (ref 80.0–100.0)
MONO ABS: 2.2 10*3/uL — AB (ref 0.1–1.0)
Metamyelocytes Relative: 0 %
Monocytes Relative: 7 %
Myelocytes: 0 %
Neutro Abs: 28.1 10*3/uL — ABNORMAL HIGH (ref 1.7–7.7)
Neutrophils Relative %: 90 %
Other: 0 %
Platelets: 271 10*3/uL (ref 150–400)
Promyelocytes Relative: 0 %
RBC: 3.8 MIL/uL — ABNORMAL LOW (ref 4.22–5.81)
RDW: 12.5 % (ref 11.5–15.5)
WBC: 31.2 10*3/uL — ABNORMAL HIGH (ref 4.0–10.5)
nRBC: 0 % (ref 0.0–0.2)
nRBC: 0 /100 WBC

## 2018-12-15 LAB — COMPREHENSIVE METABOLIC PANEL
ALT: 16 U/L (ref 0–44)
AST: 24 U/L (ref 15–41)
Albumin: 3.1 g/dL — ABNORMAL LOW (ref 3.5–5.0)
Alkaline Phosphatase: 73 U/L (ref 38–126)
Anion gap: 7 (ref 5–15)
BUN: 13 mg/dL (ref 6–20)
CO2: 27 mmol/L (ref 22–32)
Calcium: 8.7 mg/dL — ABNORMAL LOW (ref 8.9–10.3)
Chloride: 107 mmol/L (ref 98–111)
Creatinine, Ser: 0.68 mg/dL (ref 0.61–1.24)
GFR calc Af Amer: >60 mL/min (ref >60)
GFR calc non Af Amer: >60 mL/min (ref >60)
Glucose, Bld: 133 mg/dL — ABNORMAL HIGH (ref 70–99)
Potassium: 3.7 mmol/L (ref 3.5–5.1)
Sodium: 141 mmol/L (ref 135–145)
TOTAL PROTEIN: 6.3 g/dL — AB (ref 6.5–8.1)
Total Bilirubin: 0.7 mg/dL (ref 0.3–1.2)

## 2018-12-15 LAB — MAGNESIUM: Magnesium: 1.8 mg/dL (ref 1.7–2.4)

## 2018-12-15 LAB — SODIUM
Sodium: 141 mmol/L (ref 135–145)
Sodium: 155 mmol/L — ABNORMAL HIGH (ref 135–145)

## 2018-12-15 LAB — URIC ACID: Uric Acid, Serum: 4.1 mg/dL (ref 3.7–8.6)

## 2018-12-15 LAB — BASIC METABOLIC PANEL
BUN: 10 mg/dL (ref 6–20)
CO2: 24 mmol/L (ref 22–32)
Calcium: 7.1 mg/dL — ABNORMAL LOW (ref 8.9–10.3)
Creatinine, Ser: 0.67 mg/dL (ref 0.61–1.24)
GFR calc Af Amer: >60 mL/min (ref >60)
GFR calc non Af Amer: >60 mL/min (ref >60)
Glucose, Bld: 129 mg/dL — ABNORMAL HIGH (ref 70–99)
POTASSIUM: 3.4 mmol/L — AB (ref 3.5–5.1)
Sodium: 159 mmol/L — ABNORMAL HIGH (ref 135–145)

## 2018-12-15 LAB — PHOSPHORUS: PHOSPHORUS: 1.9 mg/dL — AB (ref 2.5–4.6)

## 2018-12-15 LAB — GAMMA GT: GGT: 14 U/L (ref 7–50)

## 2018-12-15 MED ORDER — OSMOLITE 1.2 CAL PO LIQD
1000.0000 mL | ORAL | Status: DC
  Administered 2018-12-15 – 2018-12-16 (×2): 1000 mL
  Filled 2018-12-15 (×3): qty 1000

## 2018-12-15 MED ORDER — SODIUM CHLORIDE 3 % IV SOLN
INTRAVENOUS | Status: DC
  Administered 2018-12-16 (×2): 75 mL/h via INTRAVENOUS
  Filled 2018-12-15 (×4): qty 500

## 2018-12-15 MED ORDER — ADULT MULTIVITAMIN W/MINERALS CH
1.0000 | ORAL_TABLET | Freq: Every day | ORAL | Status: DC
  Administered 2018-12-15 – 2018-12-17 (×2): 1
  Filled 2018-12-15 (×3): qty 1

## 2018-12-15 MED ORDER — PRO-STAT SUGAR FREE PO LIQD
30.0000 mL | Freq: Two times a day (BID) | ORAL | Status: DC
  Administered 2018-12-15 – 2018-12-23 (×16): 30 mL
  Filled 2018-12-15 (×16): qty 30

## 2018-12-15 MED ORDER — ASPIRIN 300 MG RE SUPP
300.0000 mg | Freq: Every day | RECTAL | Status: DC
  Administered 2018-12-15: 300 mg via RECTAL
  Filled 2018-12-15: qty 1

## 2018-12-15 MED ORDER — SODIUM CHLORIDE 3 % IV SOLN
INTRAVENOUS | Status: DC
  Administered 2018-12-15: 75 mL/h via INTRAVENOUS
  Filled 2018-12-15 (×4): qty 500

## 2018-12-15 NOTE — Progress Notes (Signed)
CRITICAL VALUE ALERT  Critical Value:  Chloride >130  Date & Time Notified: 12/15/2018 & 0335  Provider Notified: Aroor, MD  Orders Received/Actions taken: none

## 2018-12-15 NOTE — Consult Note (Signed)
NAME:  Joshua Beck, MRN:  [id number], DOB:  Dec 01, 1965, LOS: 2 ADMISSION DATE:  11/23/2018, CONSULTATION DATE:  12/15/2018 REFERRING MD:  Neuro Leonie Man, CHIEF COMPLAINT:  Respiratory failure and electrolytes abrnomalities   Brief History   53 year old male with PMH of oropharyngeal cancer s/p chemo and radiation therapy, HTN and tobacco abuse who presents to the hospital with a large left MCA ischemic CVA.  Patient was admitted to the ICU and started on 3% saline.  CT showed a 3 mm midline shift due to cytotoxic and NS was consulted.  PCCM was consulted for management of his airway and concern for significant electrolytes abnormalities.  Patient is non-verbal and unable to provide further history.  History of present illness   53 year old male with PMH of oropharyngeal cancer s/p chemo and radiation therapy, HTN and tobacco abuse who presents to the hospital with a large left MCA ischemic CVA.  Patient was admitted to the ICU and started on 3% saline.  CT showed a 3 mm midline shift due to cytotoxic and NS was consulted.  PCCM was consulted for management of his airway and concern for significant electrolytes abnormalities.  Patient is non-verbal and unable to provide further history.  Past Medical History  CVA Carotid artery stenosis Oropharyngeal cancer  Significant Hospital Events   CVA  Consults:  PCCM NS  Procedures:  None  Significant Diagnostic Tests:  CT of the head that I reviewed myself with L MCA CVA with a midline shift  Micro Data:  N/A  Antimicrobials:  N/A   Interim history/subjective:  Nonverbal, no events and no new complaints  Objective   Blood pressure (!) 146/94, pulse 95, temperature 97.9 F (36.6 C), temperature source Oral, resp. rate 17, height 6' (1.829 m), weight 69.6 kg, SpO2 98 %.        Intake/Output Summary (Last 24 hours) at 12/15/2018 1104 Last data filed at 12/15/2018 0900 Gross per 24 hour  Intake 2352.82 ml  Output 975 ml  Net  1377.82 ml   Filed Weights   12/18/2018 2117  Weight: 69.6 kg    Examination: General: Acute on chronically ill appearing patient, in no respiratory distress HENT: [place]/AT, PERRL, EOM-I and MMM Lungs: Coarse BS with transmitted upper airway sounds Cardiovascular: RRR, Nl S1/S2 and -M/R/G Abdomen: Soft, NT, ND and +BS Extremities: -edema and -tenderness Neuro: Right hemiparesis, follows command on the left, very weak cough but gag is intact Skin: Intact  Resolved Hospital Problem list   N/A  Assessment & Plan:  53 year old male with L MCA CVA who has a midline shift who had a concern for airway protection and medical concerns.  PCCM was consulted.  Discussed with PCCM-NP  CVA:  - 3% per neuro   - BP management as ordered  Airway protection concerns:  - Patient is able to protect his airway for now  - Will come back and re-evaluate in the afternoon to see if that changes post CT given midline shift  Hypokalemia:  - Replace  - BMET in AM  - IVF per NS (3%)  - BMET at 3 PM  FEN:   - Place cortrak  - Consult nutrition for TF as per nutrition  Leukocytosis: likely stress response  - Check PCT  - Check CXR  - Hold of cultures and abx for now given afebrile  GOC: intubation only at this point, may need involvement of palliative care  PCCM will follow  Labs  CBC: Recent Labs  Lab 12/17/2018 2043 12/14/18 0511 12/14/18 1643  WBC 23.0* 32.2* 33.4*  NEUTROABS 21.7*  --  30.4*  HGB 15.9 15.8 14.2  HCT 49.3 46.6 43.7  MCV 100.6* 97.5 100.2*  PLT 348 353 428    Basic Metabolic Panel: Recent Labs  Lab 11/29/2018 2043 12/19/2018 2052 12/07/2018 2311 12/14/18 0511 12/14/18 0943 12/14/18 1451 12/14/18 1643 12/15/18 0220 12/15/18 0800  NA 134*  --   --  133* 136 136 136 159* 141  K 4.6  --   --  4.0  --   --  4.2 3.4*  --   CL 97*  --   --  98  --   --  99 >130*  --   CO2 24  --   --  28  --   --  28 24  --   GLUCOSE 165*  --   --  139*  --   --  121* 129*  --   BUN  11  --   --  11  --   --  11 10  --   CREATININE 1.05 1.10  --  0.73  --   --  0.83 0.67  --   CALCIUM 9.3  --   --  8.9  --   --  8.7* 7.1*  --   MG  --   --   --   --   --   --   --  1.8  --   PHOS  --   --  4.0  --   --   --  2.9  --   --    GFR: Estimated Creatinine Clearance: 106.3 mL/min (by C-G formula based on SCr of 0.67 mg/dL). Recent Labs  Lab 12/17/2018 2043 12/14/18 0511 12/14/18 1643  WBC 23.0* 32.2* 33.4*    Liver Function Tests: Recent Labs  Lab 12/08/2018 2043 12/14/18 0511 12/14/18 1643  AST 24 22 21   ALT 20 19 16   ALKPHOS 87 79 78  BILITOT 0.8 0.9 0.8  PROT 7.5 6.9 6.3*  ALBUMIN 4.1 3.6 3.4*   No results for input(s): LIPASE, AMYLASE in the last 168 hours. No results for input(s): AMMONIA in the last 168 hours.  ABG No results found for: PHART, PCO2ART, PO2ART, HCO3, TCO2, ACIDBASEDEF, O2SAT   Coagulation Profile: Recent Labs  Lab 12/12/2018 2043  INR 0.95    Cardiac Enzymes: No results for input(s): CKTOTAL, CKMB, CKMBINDEX, TROPONINI in the last 168 hours.  HbA1C: Hgb A1c MFr Bld  Date/Time Value Ref Range Status  12/14/2018 12:04 AM 6.1 (H) 4.8 - 5.6 % Final    Comment:    (NOTE) Pre diabetes:          5.7%-6.4% Diabetes:              >6.4% Glycemic control for   <7.0% adults with diabetes     CBG: Recent Labs  Lab 12/15/18 0304 12/15/18 0403 12/15/18 0621 12/15/18 0827 12/15/18 1013  GLUCAP 150* 148* 145* 153* 128*    Review of Systems:   Unattainable, aphasic  Past Medical History  He,  has a past medical history of Lung cancer ([place]).   Surgical History   History reviewed. No pertinent surgical history.   Social History      Family History   His family history is not on file.   Allergies No Known Allergies   Home Medications  Prior to Admission medications   Medication Sig Start  Date End Date Taking? Authorizing Provider  aspirin EC 81 MG tablet Take 81 mg by mouth 3 (three) times a week.   Yes [provider]  gabapentin (NEURONTIN) 300 MG capsule Take 300 mg by mouth 4 (four) times daily as needed (for nerve pain).   Yes [provider]  hydrochlorothiazide (MICROZIDE) 12.5 MG capsule Take 12.5 mg by mouth daily.   Yes [provider]  HYDROcodone-acetaminophen (NORCO) 10-325 MG tablet Take 1 tablet by mouth daily as needed (for pain).   Yes [provider]  lisinopril (PRINIVIL,ZESTRIL) 10 MG tablet Take 10 mg by mouth daily.   Yes [provider]  morphine (MS CONTIN) 30 MG 12 hr tablet Take 30 mg by mouth at bedtime as needed for pain.   Yes [provider]  multivitamin (ONE-A-DAY MEN'S) TABS tablet Take 1 tablet by mouth daily.   Yes [provider]  traZODone (DESYREL) 50 MG tablet Take 50 mg by mouth at bedtime as needed for sleep.   Yes [provider]    The patient is critically ill with multiple organ systems failure and requires high complexity decision making for assessment and support, frequent evaluation and titration of therapies, application of advanced monitoring technologies and extensive interpretation of multiple databases.   Critical Care Time devoted to patient care services described in this note is  45  Minutes. This time reflects time of care of this signee Dr Jennet Maduro. This critical care time does not reflect procedure time, or teaching time or supervisory time of PA/NP/Med student/Med Resident etc but could involve care discussion time.  Rush Farmer, M.D. [facility] Pulmonary/Critical Care Medicine. Pager: [phone]. After hours pager: [phone].

## 2018-12-15 NOTE — Progress Notes (Signed)
Initial Nutrition Assessment  DOCUMENTATION CODES:   Not applicable  INTERVENTION:   Initiate Osmolite 1.2 @ 30 ml/hr and increase by 10 ml every 12 hours to goal rate of 70 ml/hr via Cortrak tube 30 ml Prostat BID MVI daily  Provides: 2116 kcal, 123 grams protein, and 1362 ml free water.   Monitor phosphorus and magnesium every 12 hours with initiation of enteral nutrition support therapy per protocol. Continue to monitor potassium. MD to replete as appropriate.    NUTRITION DIAGNOSIS:   Inadequate oral intake related to inability to eat as evidenced by NPO status.  GOAL:   Patient will meet greater than or equal to 90% of their needs  MONITOR:   TF tolerance, I & O's  REASON FOR ASSESSMENT:   Consult Enteral/tube feeding initiation and management  ASSESSMENT:   Pt with PMH of oropharyngeal cancer s/p chemo, XRT, HTN, tobacco abuse, who was admitted with large L MCA ischemic CVA.    Pt in the ICU but remains off the vent for now.  Pt unable to answer any questions. This RD placed his Cortrak feeding tube. Spoke with SLP at bedside. She is unsure how much of his swallow was impacted by his cancer hx s/p XRT. Pt failed his swallow eval this am.   Medications reviewed  Labs reviewed: NA 159 (H) on 3%, K+ 3.4 (L), Cl >130 (H)    NUTRITION - FOCUSED PHYSICAL EXAM:    Most Recent Value  Orbital Region  No depletion  Upper Arm Region  Mild depletion  Thoracic and Lumbar Region  No depletion  Buccal Region  Unable to assess  Temple Region  No depletion  Clavicle Bone Region  No depletion  Clavicle and Acromion Bone Region  No depletion  Scapular Bone Region  Unable to assess  Dorsal Hand  No depletion  Patellar Region  Moderate depletion  Anterior Thigh Region  Moderate depletion  Posterior Calf Region  Moderate depletion  Edema (RD Assessment)  None  Hair  Reviewed  Eyes  Reviewed  Mouth  Reviewed [poor dentition]  Skin  Reviewed  Nails  Reviewed        Diet Order:   Diet Order            Diet NPO time specified  Diet effective now              EDUCATION NEEDS:   No education needs have been identified at this time  Skin:  Skin Assessment: Reviewed RN Assessment  Last BM:  unknown  Height:   Ht Readings from Last 1 Encounters:  12/04/2018 6' (1.829 m)    Weight:   Wt Readings from Last 1 Encounters:  12/21/2018 69.6 kg    Ideal Body Weight:  80.9 kg  BMI:  Body mass index is 20.81 kg/m.  Estimated Nutritional Needs:   Kcal:  2000-2300  Protein:  100-115 grams  Fluid:  > 2 L/day  Maylon Peppers RD, LDN, CNSC [phone] Pager [phone] After Hours Pager

## 2018-12-15 NOTE — Procedures (Signed)
Cortrak  Person Inserting Tube:  Joshua Beck, RD Tube Type:  Cortrak - 43 inches Tube Location:  Right nare Initial Placement:  Postpyloric Secured by: Bridle Technique Used to Measure Tube Placement:  Documented cm marking at nare/ corner of mouth Cortrak Secured At:  83 cm    Cortrak Tube Team Note:  Consult received to place a Cortrak feeding tube.   No x-ray is required. RN may begin using tube.   If the tube becomes dislodged please keep the tube and contact the Cortrak team at [email or web address] ([email or web address]) for replacement.  If after hours and replacement cannot be delayed, place a NG tube and confirm placement with an abdominal x-ray.    [place], [place], [place] Pager [phone] After Hours Pager

## 2018-12-15 NOTE — Progress Notes (Signed)
RN notified Stroke NP of sodium level being 141 at 1334. 3% saline has been resumed since 12pm today, infusing at 72ml/hr. No new orders received at this time, RN will continue to assess and monitor closely and continue with serial sodium checks.

## 2018-12-15 NOTE — Progress Notes (Signed)
SLP Cancellation Note  Patient Details Name: Joshua Beck MRN: [id number] DOB: 04/16/1966   Cancelled treatment:       Reason Eval/Treat Not Completed: Patient's level of consciousness. Pt was approached for aphasia treatment but he was unable to demonstrate/maintain an adequate level of alertness to participate in therapy and his nurse, Caryl Pina, reported that his has been his status since the swallow study. SLP will continue to follow pt.    Kathyann Spaugh I. Hardin Negus, [place], [place] Office number [phone] Pager [place] 12/15/2018, 3:13 PM

## 2018-12-15 NOTE — Progress Notes (Signed)
Pt. Had a 9 run of Vtach and a 4 run of vtach. Labs drawn, will attempt to catch it on the 12 lead EKG. Neurology notified.   Will continue to monitor.   Leticia Clas, RN BSN

## 2018-12-15 NOTE — Progress Notes (Signed)
Subjective: Patient resting in bed, hypertonic saline stopped by neurology because of sodium of 159.  Continues on experimental protocol per neurology.  Objective: Vital signs in last 24 hours: Vitals:   12/15/18 0500 12/15/18 0600 12/15/18 0700 12/15/18 0730  BP: (!) 171/102 (!) 161/96 (!) 167/112 (!) 165/90  Pulse: (!) 109 (!) 110 (!) 110 (!) 105  Resp: 20 17 (!) 23 20  Temp:      TempSrc:      SpO2: 95% 96% 96% 96%  Weight:      Height:        Intake/Output from previous day: 01/23 0701 - 01/24 0700 In: 2384.9 [I.V.:2146.5; IV Piggyback:238.3] Out: 975 [Urine:975] Intake/Output this shift: No intake/output data recorded.  Physical Exam: Awake, opening eyes spontaneously.  Occasional utterances.  Following central commands (such as close and open eyes), but not peripheral commands (such as hold up 2 fingers).  Pupils 3 mm bilaterally, round, reactive to light.  Good movement of left upper and lower extremity (purposeful), limited movement of right lower extremity, no movement of right upper extremity.  CBC Recent Labs    12/14/18 0511 12/14/18 1643  WBC 32.2* 33.4*  HGB 15.8 14.2  HCT 46.6 43.7  PLT 353 315   BMET Recent Labs    12/14/18 1643 12/15/18 0220  NA 136 159*  K 4.2 3.4*  CL 99 >130*  CO2 28 24  GLUCOSE 121* 129*  BUN 11 10  CREATININE 0.83 0.67  CALCIUM 8.7* 7.1*    Assessment/Plan: Stable, neurologically without change.  There remains no indication for neurosurgical intervention.  Stroke care per the stroke neurology service.   Hosie Spangle, MD 12/15/2018, 8:19 AM

## 2018-12-15 NOTE — Progress Notes (Signed)
RN notified Neurology NP of pt's morning sodium level being 141. Verbal orders received to DC NS infusion and restart 3% at 12ml/hr. RN will continue to monitor pt closely and check sodium levels as ordered.

## 2018-12-15 NOTE — Progress Notes (Signed)
Modified Barium Swallow Progress Note  Patient Details  Name: Joshua Beck MRN: [id number] Date of Birth: 18-Sep-1966  Today's Date: 12/15/2018  Modified Barium Swallow completed.  Full report located under Chart Review in the Imaging Section.  Brief recommendations include the following:  Clinical Impression  Pt was seen in radiology suite for modified barium swallow study. He was seated upright in swallow chair for the study which was conducted in lateral view. Pt demonstrated difficulty maintaining his body at midline and support was required to reduce his lean to the right. The view of the larynx was partly obscured due to the position of his left shoulder. Multiple attempts were made to re-position the patient for optimal viewing but were unsuccessful. Trials were limited to puree, and honey thick liquids due to the pt's performance and high risk of aspiration. He presents with severe oropharyngeal dysphagia. The oral phase of the swallow was significant for reduced A-P transport, moderate lingual residue, reduced lingual retraction, and inadeqate velopharyngeal closure which resulted in nasal regurgitation. Concerning the pharyngeal phase, the pt demonstrated moderate vallecular residue, a pharyngeal delay, reduced pharyngeal constriction which resulted in pharyngeal wall residue, and reduced hyolaryngeal elevation and excursion which consistently resulted in penetration during the swallow. Instances of aspiration were not observed during the study but this could be due to the positioning of the pt's shoulder which reduced the visibility of the pt's larynx.  Pt is at high risk of aspiration before, during, and after deglutition. The use of suctioning was necessary at the end of the study to remove oral and some pharyngeal residue in an effort to further reduce aspiration of residue. It is recommended that the pt's NPO status be maintained that non-oral alimentation (e.g., Cortrak) be initiated.  SLP will re-assess pt next week to determine improvement in swallow function.    Swallow Evaluation Recommendations       SLP Diet Recommendations: NPO;Alternative means - temporary       Medication Administration: Via alternative means               Oral Care Recommendations: Oral care QID      Marisa Hufstetler I. Hardin Negus, [place], [place] Office number [phone] Pager [phone]   Horton Marshall 12/15/2018,1:46 PM

## 2018-12-15 NOTE — Progress Notes (Addendum)
STROKE TEAM PROGRESS NOTE   INTERVAL HISTORY No family at bedside.  Patient awake but still remains lethargic.  Last sodium 159 and 3% on hold.  Repeat sodium pending. Enrolled in the CHARM trial.( IV Glyburide versus placebo ) for cerebral edema   Vitals:   12/15/18 0600 12/15/18 0700 12/15/18 0730 12/15/18 0800  BP: (!) 161/96 (!) 167/112 (!) 165/90 (!) 147/93  Pulse: (!) 110 (!) 110 (!) 105 (!) 101  Resp: 17 (!) 23 20 17   Temp:      TempSrc:      SpO2: 96% 96% 96% 98%  Weight:      Height:        CBC:  Recent Labs  Lab 11/26/2018 2043 12/14/18 0511 12/14/18 1643  WBC 23.0* 32.2* 33.4*  NEUTROABS 21.7*  --  30.4*  HGB 15.9 15.8 14.2  HCT 49.3 46.6 43.7  MCV 100.6* 97.5 100.2*  PLT 348 353 989    Basic Metabolic Panel:  Recent Labs  Lab 12/01/2018 2311  12/14/18 1643 12/15/18 0220  NA  --    < > 136 159*  K  --    < > 4.2 3.4*  CL  --    < > 99 >130*  CO2  --    < > 28 24  GLUCOSE  --    < > 121* 129*  BUN  --    < > 11 10  CREATININE  --    < > 0.83 0.67  CALCIUM  --    < > 8.7* 7.1*  MG  --   --   --  1.8  PHOS 4.0  --  2.9  --    < > = values in this interval not displayed.   Lipid Panel:     Component Value Date/Time   CHOL 212 (H) 12/14/2018 0004   TRIG 59 12/14/2018 0004   HDL 50 12/14/2018 0004   CHOLHDL 4.2 12/14/2018 0004   VLDL 12 12/14/2018 0004   LDLCALC 150 (H) 12/14/2018 0004   HgbA1c:  Lab Results  Component Value Date   HGBA1C 6.1 (H) 12/14/2018   Urine Drug Screen: No results found for: LABOPIA, COCAINSCRNUR, LABBENZ, AMPHETMU, THCU, LABBARB  Alcohol Level No results found for: ETH  IMAGING Ct Angio Head W Or Wo Contrast  Result Date: 11/22/2018 CLINICAL DATA:  Right-sided weakness and slurred speech EXAM: CT ANGIOGRAPHY HEAD AND NECK CT PERFUSION BRAIN TECHNIQUE: Multidetector CT imaging of the head and neck was performed using the standard protocol during bolus administration of intravenous contrast. Multiplanar CT image  reconstructions and MIPs were obtained to evaluate the vascular anatomy. Carotid stenosis measurements (when applicable) are obtained utilizing NASCET criteria, using the distal internal carotid diameter as the denominator. Multiphase CT imaging of the brain was performed following IV bolus contrast injection. Subsequent parametric perfusion maps were calculated using RAPID software. CONTRAST:  179mL ISOVUE-370 IOPAMIDOL (ISOVUE-370) INJECTION 76% COMPARISON:  None. FINDINGS: ASPECTS ([place] Stroke Program Early CT Score) from earlier noncontrast head CT: 3 CTA NECK FINDINGS SKELETON: There is no bony spinal canal stenosis. No lytic or blastic lesion. OTHER NECK: Normal pharynx, larynx and major salivary glands. No cervical lymphadenopathy. Unremarkable thyroid gland. UPPER CHEST: No pneumothorax or pleural effusion. No nodules or masses. AORTIC ARCH: There is no calcific atherosclerosis of the aortic arch. There is no aneurysm, dissection or hemodynamically significant stenosis of the visualized ascending aorta and aortic arch. Conventional 3 vessel aortic branching pattern. The visualized proximal subclavian arteries  are widely patent. RIGHT CAROTID SYSTEM: --Common carotid artery: Widely patent origin without common carotid artery dissection or aneurysm. --Internal carotid artery: No dissection, occlusion or aneurysm. There is hypodense atherosclerosis extending into the proximal ICA, resulting in 70% stenosis. --External carotid artery: No acute abnormality. LEFT CAROTID SYSTEM: --Common carotid artery: There is diffuse narrowing of the left common carotid artery with near complete occlusion proximal to the bifurcation. --Internal carotid artery: There is diffuse narrowing of the internal carotid artery. By NASCET criteria, the stenosis measures less than 50%. --External carotid artery: No acute abnormality. VERTEBRAL ARTERIES: Codominant configuration. Both origins are normal. No dissection, occlusion or  flow-limiting stenosis to the vertebrobasilar confluence. CTA HEAD FINDINGS POSTERIOR CIRCULATION: --Basilar artery: Normal. --Posterior cerebral arteries: Normal. The right PCA is predominantly supplied by the posterior communicating artery. --Superior cerebellar arteries: Normal. --Inferior cerebellar arteries: Normal anterior and posterior inferior cerebellar arteries. ANTERIOR CIRCULATION: --Intracranial internal carotid arteries: Mild narrowing of the skull base left ICA. --Anterior cerebral arteries: Normal --Middle cerebral arteries: Complete occlusion of the left MCA with essentially no collateralization. Normal right MCA. --Posterior communicating arteries: Present bilaterally. VENOUS SINUSES: As permitted by contrast timing, patent. ANATOMIC VARIANTS: None DELAYED PHASE: Not performed. Review of the MIP images confirms the above findings. CT Brain Perfusion Findings: CBF (<30%) Volume: 111mL Perfusion (Tmax>6.0s) volume: 261mL Mismatch Volume: 8mL Infarction Location:Left MCA distribution IMPRESSION: 1. Complete occlusion of the left middle cerebral artery with no collateralization. 2. Large core infarct of 194 mL in the left MCA territory. 3. Diffuse narrowing of the left carotid system, with short segment occlusion of the distal left common carotid artery. 4. 70% stenosis of the right internal carotid artery at the bifurcation. 5. ASPECTS is 3. Critical Value/emergent results were called by telephone at the time of interpretation on 12/07/2018 at 9:23 pm to Dr. Samara Snide , who verbally acknowledged these results. Electronically Signed   By: Ulyses Jarred M.D.   On: 11/29/2018 21:40   Ct Head Wo Contrast  Result Date: 12/14/2018 CLINICAL DATA:  Follow up stroke, LEFT middle cerebral artery occlusion. EXAM: CT HEAD WITHOUT CONTRAST TECHNIQUE: Contiguous axial images were obtained from the base of the skull through the vertex without intravenous contrast. COMPARISON:  CT HEAD December 13, 2018  FINDINGS: BRAIN: Confluent cytotoxic edema LEFT frontotemporal parietal lobes and new LEFT basal ganglia propagation. No hemorrhagic conversion. Regional mass effect without midline shift. No hydrocephalus or parenchymal brain volume loss for age. No abnormal extra-axial fluid collections. Basal cisterns are patent. VASCULAR: Dense LEFT MCA consistent with thromboembolism. SKULL/SOFT TISSUES: No skull fracture. No significant soft tissue swelling. ORBITS/SINUSES: The included ocular globes and orbital contents are normal.Chronic maxillary sinusitis. Mastoid air cells are well aerated. OTHER: None. IMPRESSION: 1. Evolving acute large LEFT MCA territory infarct propagated to LEFT basal ganglia. No hemorrhagic conversion. Electronically Signed   By: Elon Alas M.D.   On: 12/14/2018 04:25   Ct Angio Neck W And/or Wo Contrast  Result Date: 12/22/2018 CLINICAL DATA:  Right-sided weakness and slurred speech EXAM: CT ANGIOGRAPHY HEAD AND NECK CT PERFUSION BRAIN TECHNIQUE: Multidetector CT imaging of the head and neck was performed using the standard protocol during bolus administration of intravenous contrast. Multiplanar CT image reconstructions and MIPs were obtained to evaluate the vascular anatomy. Carotid stenosis measurements (when applicable) are obtained utilizing NASCET criteria, using the distal internal carotid diameter as the denominator. Multiphase CT imaging of the brain was performed following IV bolus contrast injection. Subsequent parametric perfusion maps were calculated  using RAPID software. CONTRAST:  136mL ISOVUE-370 IOPAMIDOL (ISOVUE-370) INJECTION 76% COMPARISON:  None. FINDINGS: ASPECTS ([place] Stroke Program Early CT Score) from earlier noncontrast head CT: 3 CTA NECK FINDINGS SKELETON: There is no bony spinal canal stenosis. No lytic or blastic lesion. OTHER NECK: Normal pharynx, larynx and major salivary glands. No cervical lymphadenopathy. Unremarkable thyroid gland. UPPER CHEST: No  pneumothorax or pleural effusion. No nodules or masses. AORTIC ARCH: There is no calcific atherosclerosis of the aortic arch. There is no aneurysm, dissection or hemodynamically significant stenosis of the visualized ascending aorta and aortic arch. Conventional 3 vessel aortic branching pattern. The visualized proximal subclavian arteries are widely patent. RIGHT CAROTID SYSTEM: --Common carotid artery: Widely patent origin without common carotid artery dissection or aneurysm. --Internal carotid artery: No dissection, occlusion or aneurysm. There is hypodense atherosclerosis extending into the proximal ICA, resulting in 70% stenosis. --External carotid artery: No acute abnormality. LEFT CAROTID SYSTEM: --Common carotid artery: There is diffuse narrowing of the left common carotid artery with near complete occlusion proximal to the bifurcation. --Internal carotid artery: There is diffuse narrowing of the internal carotid artery. By NASCET criteria, the stenosis measures less than 50%. --External carotid artery: No acute abnormality. VERTEBRAL ARTERIES: Codominant configuration. Both origins are normal. No dissection, occlusion or flow-limiting stenosis to the vertebrobasilar confluence. CTA HEAD FINDINGS POSTERIOR CIRCULATION: --Basilar artery: Normal. --Posterior cerebral arteries: Normal. The right PCA is predominantly supplied by the posterior communicating artery. --Superior cerebellar arteries: Normal. --Inferior cerebellar arteries: Normal anterior and posterior inferior cerebellar arteries. ANTERIOR CIRCULATION: --Intracranial internal carotid arteries: Mild narrowing of the skull base left ICA. --Anterior cerebral arteries: Normal --Middle cerebral arteries: Complete occlusion of the left MCA with essentially no collateralization. Normal right MCA. --Posterior communicating arteries: Present bilaterally. VENOUS SINUSES: As permitted by contrast timing, patent. ANATOMIC VARIANTS: None DELAYED PHASE: Not  performed. Review of the MIP images confirms the above findings. CT Brain Perfusion Findings: CBF (<30%) Volume: 150mL Perfusion (Tmax>6.0s) volume: 281mL Mismatch Volume: 69mL Infarction Location:Left MCA distribution IMPRESSION: 1. Complete occlusion of the left middle cerebral artery with no collateralization. 2. Large core infarct of 194 mL in the left MCA territory. 3. Diffuse narrowing of the left carotid system, with short segment occlusion of the distal left common carotid artery. 4. 70% stenosis of the right internal carotid artery at the bifurcation. 5. ASPECTS is 3. Critical Value/emergent results were called by telephone at the time of interpretation on 12/22/2018 at 9:23 pm to Dr. Samara Snide , who verbally acknowledged these results. Electronically Signed   By: Ulyses Jarred M.D.   On: 12/08/2018 21:40   Mr Brain Wo Contrast  Result Date: 12/14/2018 CLINICAL DATA:  Follow-up examination for acute left MCA territory stroke. EXAM: MRI HEAD WITHOUT CONTRAST TECHNIQUE: Multiplanar, multiecho pulse sequences of the brain and surrounding structures were obtained without intravenous contrast. COMPARISON:  Prior CT and CTA from 12/13/2017 FINDINGS: Brain: Examination mildly degraded by motion artifact. Large confluent area of restricted diffusion involving nearly the entirety of the left MCA territory seen, compatible with evolving acute left MCA territory infarct. This closely resembles previously seen core infarct on prior CT perfusion. Involvement of the left basal ganglia. Cytotoxic edema throughout the area of infarction with developing regional mass effect. Left lateral ventricle partially effaced. Early left-to-right midline shift measuring up to 3 mm now evident. No hydrocephalus or ventricular trapping at this time. Basilar cisterns remain patent. Scattered areas of petechial hemorrhage within the area of infarction without frank hemorrhagic transformation. Few  additional punctate acute ischemic  infarcts seen involving the right cerebral hemisphere (series 5, image 83, 75, 66). No associated hemorrhage. Findings are likely embolic. Underlying mild chronic small vessel ischemic disease. Remote lacunar infarct noted within the right cerebral hemispheric white matter. 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 No mass lesion. No extra-axial fluid collection. Vascular: Extensive susceptibility artifact throughout the left M1 segment and proximal left MCA branches, compatible with occlusive thrombus. Abnormal flow void seen within the left ICA to the cavernous segment, which could be related to slow flow and/or occlusion (series 9, image 4). Left ICA was major intracranial vascular flow voids otherwise maintained. Patent on prior CTA. Skull and upper cervical spine: Craniocervical junction within normal limits. Degenerative spondylolysis noted at C4-5, partially visualized. Remainder the visualized upper cervical spine otherwise unremarkable. Bone marrow signal intensity within normal limits. No scalp soft tissue abnormality. Sinuses/Orbits: Globes and orbital soft tissues demonstrate no acute finding. Scattered mucosal thickening throughout the paranasal sinuses. Superimposed right maxillary sinus retention cyst. Small bilateral mastoid effusions, left greater than right. Inner ear structures grossly normal. Other: None. IMPRESSION: 1. Large confluent evolving acute ischemic left MCA territory infarct. Associated extensive cytotoxic edema with developing regional mass effect and new 3 mm left-to-right midline shift. No hydrocephalus or ventricular trapping. Associated scattered petechial hemorrhage within the area of infarction without frank hemorrhagic transformation. 2. Evidence for occlusive thrombus throughout the left middle cerebral artery, stable from prior CTA. Abnormal flow void within the left ICA to the cavernous segment may reflect slow flow and/or  occlusion. 3. Few additional subcentimeter ischemic infarcts within the right cerebral hemisphere as above, likely embolic. Electronically Signed   By: Jeannine Boga M.D.   On: 12/14/2018 15:14   Ct Cerebral Perfusion W Contrast  Result Date: 12/03/2018 CLINICAL DATA:  Right-sided weakness and slurred speech EXAM: CT ANGIOGRAPHY HEAD AND NECK CT PERFUSION BRAIN TECHNIQUE: Multidetector CT imaging of the head and neck was performed using the standard protocol during bolus administration of intravenous contrast. Multiplanar CT image reconstructions and MIPs were obtained to evaluate the vascular anatomy. Carotid stenosis measurements (when applicable) are obtained utilizing NASCET criteria, using the distal internal carotid diameter as the denominator. Multiphase CT imaging of the brain was performed following IV bolus contrast injection. Subsequent parametric perfusion maps were calculated using RAPID software. CONTRAST:  130mL ISOVUE-370 IOPAMIDOL (ISOVUE-370) INJECTION 76% COMPARISON:  None. FINDINGS: ASPECTS ([place] Stroke Program Early CT Score) from earlier noncontrast head CT: 3 CTA NECK FINDINGS SKELETON: There is no bony spinal canal stenosis. No lytic or blastic lesion. OTHER NECK: Normal pharynx, larynx and major salivary glands. No cervical lymphadenopathy. Unremarkable thyroid gland. UPPER CHEST: No pneumothorax or pleural effusion. No nodules or masses. AORTIC ARCH: There is no calcific atherosclerosis of the aortic arch. There is no aneurysm, dissection or hemodynamically significant stenosis of the visualized ascending aorta and aortic arch. Conventional 3 vessel aortic branching pattern. The visualized proximal subclavian arteries are widely patent. RIGHT CAROTID SYSTEM: --Common carotid artery: Widely patent origin without common carotid artery dissection or aneurysm. --Internal carotid artery: No dissection, occlusion or aneurysm. There is hypodense atherosclerosis extending into the  proximal ICA, resulting in 70% stenosis. --External carotid artery: No acute abnormality. LEFT CAROTID SYSTEM: --Common carotid artery: There is diffuse narrowing of the left common carotid artery with near complete occlusion proximal to the bifurcation. --Internal carotid artery: There is diffuse narrowing of the internal carotid artery. By NASCET criteria, the stenosis measures less than  50%. --External carotid artery: No acute abnormality. VERTEBRAL ARTERIES: Codominant configuration. Both origins are normal. No dissection, occlusion or flow-limiting stenosis to the vertebrobasilar confluence. CTA HEAD FINDINGS POSTERIOR CIRCULATION: --Basilar artery: Normal. --Posterior cerebral arteries: Normal. The right PCA is predominantly supplied by the posterior communicating artery. --Superior cerebellar arteries: Normal. --Inferior cerebellar arteries: Normal anterior and posterior inferior cerebellar arteries. ANTERIOR CIRCULATION: --Intracranial internal carotid arteries: Mild narrowing of the skull base left ICA. --Anterior cerebral arteries: Normal --Middle cerebral arteries: Complete occlusion of the left MCA with essentially no collateralization. Normal right MCA. --Posterior communicating arteries: Present bilaterally. VENOUS SINUSES: As permitted by contrast timing, patent. ANATOMIC VARIANTS: None DELAYED PHASE: Not performed. Review of the MIP images confirms the above findings. CT Brain Perfusion Findings: CBF (<30%) Volume: 111mL Perfusion (Tmax>6.0s) volume: 241mL Mismatch Volume: 59mL Infarction Location:Left MCA distribution IMPRESSION: 1. Complete occlusion of the left middle cerebral artery with no collateralization. 2. Large core infarct of 194 mL in the left MCA territory. 3. Diffuse narrowing of the left carotid system, with short segment occlusion of the distal left common carotid artery. 4. 70% stenosis of the right internal carotid artery at the bifurcation. 5. ASPECTS is 3. Critical Value/emergent  results were called by telephone at the time of interpretation on 12/18/2018 at 9:23 pm to Dr. Samara Snide , who verbally acknowledged these results. Electronically Signed   By: Ulyses Jarred M.D.   On: 12/10/2018 21:40   Dg Chest Portable 1 View  Result Date: 12/03/2018 CLINICAL DATA:  Stroke EXAM: PORTABLE CHEST 1 VIEW COMPARISON:  01/09/2011 FINDINGS: The heart size and mediastinal contours are within normal limits. Both lungs are clear. The visualized skeletal structures are unremarkable. IMPRESSION: No active disease. Electronically Signed   By: Donavan Foil M.D.   On: 12/08/2018 22:13   Ct Head Code Stroke Wo Contrast  Result Date: 12/09/2018 CLINICAL DATA:  Code stroke. Right-sided weakness with vomiting and slurred speech EXAM: CT HEAD WITHOUT CONTRAST TECHNIQUE: Contiguous axial images were obtained from the base of the skull through the vertex without intravenous contrast. COMPARISON:  None. FINDINGS: Brain: There is no mass, hemorrhage or extra-axial collection. The size and configuration of the ventricles and extra-axial CSF spaces are normal. There is a large area acute ischemia within the left hemisphere, within the left MCA territory. Vascular: Hyperdense left MCA. Skull: The visualized skull base, calvarium and extracranial soft tissues are normal. Sinuses/Orbits: No fluid levels or advanced mucosal thickening of the visualized paranasal sinuses. No mastoid or middle ear effusion. The orbits are normal. ASPECTS [facility] Stroke Program Early CT Score) - Ganglionic level infarction (caudate, lentiform nuclei, internal capsule, insula, M1-M3 cortex): 3 - Supraganglionic infarction (M4-M6 cortex): 0 Total score (0-10 with 10 being normal): 3 IMPRESSION: 1. No acute hemorrhage. 2. Large area of acute ischemia within the left MCA territory with hyperdense left MCA. 3. ASPECTS is 3. * These results were communicated to Dr. Karena Addison Aroor at 8:58 pm on 12/10/2018 by text page via the [facility]  messaging system. Electronically Signed   By: Ulyses Jarred M.D.   On: 12/06/2018 20:59   [place] Ekg Site Rite  Result Date: 12/14/2018 If Site Rite image not attached, placement could not be confirmed due to current cardiac rhythm.   PHYSICAL EXAM per Dr. Leonie Man  middle-age Caucasian male not in distress. . Afebrile. Head is nontraumatic. Neck is supple without bruit.    Cardiac exam no murmur or gallop. Lungs are clear to auscultation. Distal pulses are well felt.  Neurological Exam :  Patient is drowsy but does open eyes. He is globally aphasic and mute. Does not follow any commands. He has head deviation to the left. Left gaze preference. Unable to look to the right. Blinks to threat on the left but not on the right. Pupils equal 3 mm reactive. Fundi not visualized. Right lower facial weakness. Tongue midline. Dense right hemiplegia with flaccidity and hypotonia. Trace withdrawal to painful stimuli in the leg but none in the right upper extremity. Purposeful antigravity strength on the left side.   ASSESSMENT/PLAN Mr. Joshua Beck is a 53 y.o. 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 Not a candidate for TPA d/t presenting outside of TPA window. Not a candidate for IR given core of 194 ml.  Enrolled in the CHARM trial (IV glyburide versus placebo for cerebral edema).  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 Aspects 3.  CTA head & neck :Complete occlusion of the left middle cerebral artery with no collateralization. Large core infarct of 194 mL in the left MCA territory. Diffuse narrowing of the left carotid  system, with short segment occlusion of the distal left common carotid artery.70% stenosis of the right internal carotid artery at the bifurcation. ASPECTS is 3.  CT perfusion: Large core infarct of 194 mL in the left MCA territory.  Repeat CT head with evolving large left MCA territory infarct there which has now propagated to the left basal ganglia   MRI large evolving left MCA territory infarct with associated extensive cytotoxic edema with developing regional mass-effect and 3 mm left-to-right shift.  Associated scattered petechial hemorrhage within the infarct.  Thrombus left MCA.  Left ICA slow flow/occlusion.  Few additional right cerebral hemisphere subcentimeter infarcts, likely embolic  2D Echo  pending  LDL 150  HgbA1c 6.1  SCD's for VTE prophylaxis. Hold pharmaceutical prophylaxis until it is clear surgery is not indicated  For now, neurosurgery is following without needed intervention  aspirin 81 mg daily prior to admission, now on No antithrombotic. Add aspirin 300 mg suppository until able to swallow  Therapy recommendations:  pending  Disposition:  pending  Cerebral edema  Induced hypernatremia Enrolled in CHARM trial (IV glyburide versus placebo for cerebral edema)  Started on 3% protocol for cerebral edema control  Sodium at 2 AM 159 with 3% turned off  Now has glyburide at 20 mL an hour and saline at 50 mL an hour  Repeat Na 141  Resume 3% at 75 mLs an hour. Stop NS at 50/h  Na goal 150-155  Check NA q6h  PICC line placement  CCM consulted   Hypertension  Stable . Permissive hypertension (OK if < 220/120) but gradually normalize in 5-7 days . Long-term BP goal normotensive  Hyperlipidemia  Home meds:  none  LDL 150, goal < 70  Add atorvastatin 80 mg daily when patient able to take PO  Continue statin at discharge  Diabetes type II  HgbA1c 6.1, goal < 7.0 to diabetic  Controlled  Dysphagia secondary to stroke    Other Stroke  Risk Factors  Cigarette smoker advised to stop smoking  Carotid stenosis   Hx stroke  Details not available  Other Active Problems  Hypokalemia 3.4- replace  Leukocytosis 33.4, likely stress response  Hospital day # 2  Burnetta Sabin, MSN, APRN, ANVP-BC, AGPCNP-BC Advanced Practice Stroke Nurse [place] for Schedule & Pager information 12/15/2018 2:42 PM  I have personally obtained history,examined this patient, reviewed notes, independently viewed imaging studies, participated in medical decision making and plan of care.ROS completed by me personally and pertinent positives fully documented  I have made any additions or clarifications directly to the above note. Agree with note above.  Patient remains sleepy but is able to protect his airway. Continue hypertonic saline with serum sodium goal 150-155. May need elective hemicraniectomy if he has neurological worsening. No family available at the bedside for discussion today. Will consult critical care to help manage fluid electrolytes and airway. Discuss with Dr.Icard This patient is critically ill and at significant risk of neurological worsening, death and care requires constant monitoring of vital signs, hemodynamics,respiratory and cardiac monitoring, extensive review of multiple databases, frequent neurological assessment, discussion with family, other specialists and medical decision making of high complexity.I have made any additions or clarifications directly to the above note.This critical care time does not reflect procedure time, or teaching time or supervisory time of PA/NP/Med Resident etc but could involve care discussion time.  I spent 35 minutes of neurocritical care time  in the care of  this patient.     Antony Contras, MD Medical Director [facility] Pager: [phone] 12/15/2018 3:54 PM  To contact Stroke Continuity provider, please refer to [email or web address] After hours, contact  General Neurology

## 2018-12-15 NOTE — Progress Notes (Signed)
OT Cancellation Note  Patient Details Name: Joshua Beck MRN: [id number] DOB: August 18, 1966   Cancelled Treatment:    Reason Eval/Treat Not Completed: Patient at procedure or test/ unavailable;Fatigue/lethargy limiting ability to participate; pt currently getting coretrak; spoke with RN and reports pt increasingly lethargic due to multiple tests/procedures today as well. Will follow up for OT eval as schedule permits and as pt is able.  Lou Cal, OT [facility] Pager [phone] Office [phone]   Raymondo Band 12/15/2018, 2:52 PM

## 2018-12-15 NOTE — Progress Notes (Signed)
PT Cancellation Note  Patient Details Name: Joshua Beck MRN: [id number] DOB: Oct 27, 1966   Cancelled Treatment:    Reason Eval/Treat Not Completed: Patient not medically ready;Patient at procedure or test/unavailable.  Getting cortrak and increasing lethargy. 12/15/2018  Donnella Sham, [place] [facility] [phone]  (pager) [phone]  (office)   [name] 12/15/2018, 3:20 PM

## 2018-12-16 ENCOUNTER — Encounter (HOSPITAL_COMMUNITY): Payer: Self-pay | Admitting: Certified Registered"

## 2018-12-16 ENCOUNTER — Inpatient Hospital Stay (HOSPITAL_COMMUNITY): Payer: Medicare Other | Admitting: Anesthesiology

## 2018-12-16 ENCOUNTER — Inpatient Hospital Stay (HOSPITAL_COMMUNITY): Payer: Medicare Other

## 2018-12-16 ENCOUNTER — Encounter (HOSPITAL_COMMUNITY): Admission: EM | Disposition: E | Payer: Self-pay | Source: Home / Self Care | Attending: Neurology

## 2018-12-16 DIAGNOSIS — J9601 Acute respiratory failure with hypoxia: Secondary | ICD-10-CM

## 2018-12-16 DIAGNOSIS — I6523 Occlusion and stenosis of bilateral carotid arteries: Secondary | ICD-10-CM

## 2018-12-16 DIAGNOSIS — R918 Other nonspecific abnormal finding of lung field: Secondary | ICD-10-CM

## 2018-12-16 DIAGNOSIS — I639 Cerebral infarction, unspecified: Secondary | ICD-10-CM

## 2018-12-16 DIAGNOSIS — G936 Cerebral edema: Secondary | ICD-10-CM

## 2018-12-16 HISTORY — PX: CRANIOTOMY: SHX93

## 2018-12-16 LAB — GLUCOSE, CAPILLARY
Glucose-Capillary: 160 mg/dL — ABNORMAL HIGH (ref 70–99)
Glucose-Capillary: 165 mg/dL — ABNORMAL HIGH (ref 70–99)
Glucose-Capillary: 132 mg/dL — ABNORMAL HIGH (ref 70–99)
Glucose-Capillary: 115 mg/dL — ABNORMAL HIGH (ref 70–99)
Glucose-Capillary: 123 mg/dL — ABNORMAL HIGH (ref 70–99)
Glucose-Capillary: 202 mg/dL — ABNORMAL HIGH (ref 70–99)
Glucose-Capillary: 237 mg/dL — ABNORMAL HIGH (ref 70–99)

## 2018-12-16 LAB — CBC
HCT: 40.2 % (ref 39.0–52.0)
Hemoglobin: 12.7 g/dL — ABNORMAL LOW (ref 13.0–17.0)
MCH: 32 pg (ref 26.0–34.0)
MCHC: 31.6 g/dL (ref 30.0–36.0)
MCV: 101.3 fL — ABNORMAL HIGH (ref 80.0–100.0)
Platelets: 283 10*3/uL (ref 150–400)
RBC: 3.97 MIL/uL — ABNORMAL LOW (ref 4.22–5.81)
RDW: 12.7 % (ref 11.5–15.5)
WBC: 30.5 10*3/uL — AB (ref 4.0–10.5)
nRBC: 0 % (ref 0.0–0.2)

## 2018-12-16 LAB — TYPE AND SCREEN
ABO/RH(D): A POS
Antibody Screen: NEGATIVE

## 2018-12-16 LAB — POCT I-STAT 7, (LYTES, BLD GAS, ICA,H+H)
Acid-Base Excess: 2 mmol/L (ref 0.0–2.0)
Bicarbonate: 27.7 mmol/L (ref 20.0–28.0)
Calcium, Ion: 1.18 mmol/L (ref 1.15–1.40)
HCT: 35 % — ABNORMAL LOW (ref 39.0–52.0)
Hemoglobin: 11.9 g/dL — ABNORMAL LOW (ref 13.0–17.0)
O2 Saturation: 98 %
Patient temperature: 97.6
Potassium: 3.4 mmol/L — ABNORMAL LOW (ref 3.5–5.1)
SODIUM: 147 mmol/L — AB (ref 135–145)
TCO2: 29 mmol/L (ref 22–32)
pCO2 arterial: 45.6 mmHg (ref 32.0–48.0)
pH, Arterial: 7.389 (ref 7.350–7.450)
pO2, Arterial: 109 mmHg — ABNORMAL HIGH (ref 83.0–108.0)

## 2018-12-16 LAB — BASIC METABOLIC PANEL
ANION GAP: 9 (ref 5–15)
BUN: 12 mg/dL (ref 6–20)
CO2: 25 mmol/L (ref 22–32)
Calcium: 8.4 mg/dL — ABNORMAL LOW (ref 8.9–10.3)
Chloride: 110 mmol/L (ref 98–111)
Creatinine, Ser: 0.68 mg/dL (ref 0.61–1.24)
GFR calc non Af Amer: >60 mL/min (ref >60)
Glucose, Bld: 167 mg/dL — ABNORMAL HIGH (ref 70–99)
Potassium: 3 mmol/L — ABNORMAL LOW (ref 3.5–5.1)
Sodium: 144 mmol/L (ref 135–145)

## 2018-12-16 LAB — ABO/RH: ABO/RH(D): A POS

## 2018-12-16 LAB — SODIUM
Sodium: 156 mmol/L — ABNORMAL HIGH (ref 135–145)
Sodium: 159 mmol/L — ABNORMAL HIGH (ref 135–145)
Sodium: 156 mmol/L — ABNORMAL HIGH (ref 135–145)
Sodium: 158 mmol/L — ABNORMAL HIGH (ref 135–145)
Sodium: 152 mmol/L — ABNORMAL HIGH (ref 135–145)

## 2018-12-16 LAB — MAGNESIUM
Magnesium: 2.3 mg/dL (ref 1.7–2.4)
Magnesium: 1.9 mg/dL (ref 1.7–2.4)

## 2018-12-16 LAB — TRIGLYCERIDES: Triglycerides: 78 mg/dL (ref <150)

## 2018-12-16 LAB — SURGICAL PCR SCREEN
MRSA, PCR: NEGATIVE
STAPHYLOCOCCUS AUREUS: NEGATIVE

## 2018-12-16 LAB — PHOSPHORUS
PHOSPHORUS: 1.4 mg/dL — AB (ref 2.5–4.6)
Phosphorus: 2.6 mg/dL (ref 2.5–4.6)

## 2018-12-16 SURGERY — CRANIOTOMY TUMOR EXCISION
Anesthesia: General | Site: Head | Laterality: Left

## 2018-12-16 MED ORDER — DEXAMETHASONE SODIUM PHOSPHATE 10 MG/ML IJ SOLN
INTRAMUSCULAR | Status: DC | PRN
  Administered 2018-12-16: 10 mg via INTRAVENOUS

## 2018-12-16 MED ORDER — PROPOFOL 1000 MG/100ML IV EMUL
0.0000 ug/kg/min | INTRAVENOUS | Status: DC
  Administered 2018-12-16: 30 ug/kg/min via INTRAVENOUS
  Administered 2018-12-17 (×2): 10 ug/kg/min via INTRAVENOUS
  Administered 2018-12-17: 30 ug/kg/min via INTRAVENOUS
  Administered 2018-12-18: 5 ug/kg/min via INTRAVENOUS
  Administered 2018-12-18: 10 ug/kg/min via INTRAVENOUS
  Administered 2018-12-18: 20 ug/kg/min via INTRAVENOUS
  Filled 2018-12-16 (×4): qty 100

## 2018-12-16 MED ORDER — BISACODYL 10 MG RE SUPP: 10.0000 mg | Freq: Every day | RECTAL | Status: DC | PRN

## 2018-12-16 MED ORDER — MUPIROCIN 2 % EX OINT: 1.0000 "application " | TOPICAL_OINTMENT | Freq: Two times a day (BID) | CUTANEOUS | Status: DC

## 2018-12-16 MED ORDER — SODIUM CHLORIDE 0.9 % IV SOLN
INTRAVENOUS | Status: DC | PRN
  Administered 2018-12-16: 500 mL

## 2018-12-16 MED ORDER — PROPOFOL 10 MG/ML IV BOLUS
INTRAVENOUS | Status: DC | PRN
  Administered 2018-12-16: 100 mg via INTRAVENOUS

## 2018-12-16 MED ORDER — SODIUM CHLORIDE 0.9 % IV SOLN
INTRAVENOUS | Status: DC | PRN
  Administered 2018-12-16: 50 ug/min via INTRAVENOUS
  Administered 2018-12-16: 100 ug/min via INTRAVENOUS

## 2018-12-16 MED ORDER — LIDOCAINE-EPINEPHRINE 1 %-1:100000 IJ SOLN
INTRAMUSCULAR | Status: DC | PRN
  Administered 2018-12-16: 5 mL via INTRADERMAL
  Administered 2018-12-16: 9.5 mL via INTRADERMAL

## 2018-12-16 MED ORDER — THROMBIN 20000 UNITS EX SOLR
CUTANEOUS | Status: AC
  Filled 2018-12-16: qty 20000

## 2018-12-16 MED ORDER — CHLORHEXIDINE GLUCONATE 0.12% ORAL RINSE (MEDLINE KIT)
15.0000 mL | Freq: Two times a day (BID) | OROMUCOSAL | Status: DC
  Administered 2018-12-16 – 2018-12-23 (×14): 15 mL via OROMUCOSAL

## 2018-12-16 MED ORDER — ORAL CARE MOUTH RINSE
15.0000 mL | OROMUCOSAL | Status: DC
  Administered 2018-12-16: 15 mL via OROMUCOSAL

## 2018-12-16 MED ORDER — PROPOFOL 10 MG/ML IV BOLUS
INTRAVENOUS | Status: AC
  Filled 2018-12-16: qty 20

## 2018-12-16 MED ORDER — BACITRACIN ZINC 500 UNIT/GM EX OINT
TOPICAL_OINTMENT | CUTANEOUS | Status: DC | PRN
  Administered 2018-12-16: 1 via TOPICAL

## 2018-12-16 MED ORDER — ROCURONIUM BROMIDE 10 MG/ML (PF) SYRINGE
PREFILLED_SYRINGE | INTRAVENOUS | Status: DC | PRN
  Administered 2018-12-16: 10 mg via INTRAVENOUS
  Administered 2018-12-16: 50 mg via INTRAVENOUS
  Administered 2018-12-16: 10 mg via INTRAVENOUS
  Administered 2018-12-16: 20 mg via INTRAVENOUS
  Administered 2018-12-16: 10 mg via INTRAVENOUS

## 2018-12-16 MED ORDER — CHLORHEXIDINE GLUCONATE 0.12% ORAL RINSE (MEDLINE KIT): 15.0000 mL | Freq: Two times a day (BID) | OROMUCOSAL | Status: DC

## 2018-12-16 MED ORDER — ALBUTEROL SULFATE HFA 108 (90 BASE) MCG/ACT IN AERS
INHALATION_SPRAY | RESPIRATORY_TRACT | Status: AC
  Filled 2018-12-16: qty 6.7

## 2018-12-16 MED ORDER — THROMBIN 20000 UNITS EX SOLR
CUTANEOUS | Status: DC | PRN
  Administered 2018-12-16: 20 mL via TOPICAL

## 2018-12-16 MED ORDER — LIDOCAINE-EPINEPHRINE 1 %-1:100000 IJ SOLN
INTRAMUSCULAR | Status: AC
  Filled 2018-12-16: qty 1

## 2018-12-16 MED ORDER — FENTANYL CITRATE (PF) 100 MCG/2ML IJ SOLN
100.0000 ug | INTRAMUSCULAR | Status: DC | PRN
  Administered 2018-12-17 – 2018-12-22 (×3): 100 ug via INTRAVENOUS
  Filled 2018-12-16 (×5): qty 2

## 2018-12-16 MED ORDER — ATORVASTATIN CALCIUM 40 MG PO TABS
40.0000 mg | ORAL_TABLET | Freq: Every day | ORAL | Status: DC
  Administered 2018-12-17 – 2018-12-20 (×4): 40 mg via ORAL
  Filled 2018-12-16 (×4): qty 1

## 2018-12-16 MED ORDER — DOCUSATE SODIUM 50 MG/5ML PO LIQD
100.0000 mg | Freq: Two times a day (BID) | ORAL | Status: DC | PRN
  Administered 2018-12-21 – 2018-12-22 (×2): 100 mg
  Filled 2018-12-16 (×2): qty 10

## 2018-12-16 MED ORDER — BUPIVACAINE HCL (PF) 0.5 % IJ SOLN
INTRAMUSCULAR | Status: DC | PRN
  Administered 2018-12-16: 9.5 mL
  Administered 2018-12-16: 5 mL

## 2018-12-16 MED ORDER — SODIUM CHLORIDE 3 % IV SOLN
INTRAVENOUS | Status: DC
  Administered 2018-12-16 – 2018-12-17 (×3): 75 mL/h via INTRAVENOUS
  Administered 2018-12-20: 50 mL/h via INTRAVENOUS
  Administered 2018-12-20 – 2018-12-22 (×6): 75 mL/h via INTRAVENOUS
  Filled 2018-12-16 (×21): qty 500

## 2018-12-16 MED ORDER — CEFAZOLIN SODIUM 1 G IJ SOLR
INTRAMUSCULAR | Status: AC
  Filled 2018-12-16: qty 20

## 2018-12-16 MED ORDER — BACITRACIN ZINC 500 UNIT/GM EX OINT
TOPICAL_OINTMENT | CUTANEOUS | Status: AC
  Filled 2018-12-16: qty 28.35

## 2018-12-16 MED ORDER — FENTANYL CITRATE (PF) 250 MCG/5ML IJ SOLN
INTRAMUSCULAR | Status: AC
  Filled 2018-12-16: qty 5

## 2018-12-16 MED ORDER — OSMOLITE 1.2 CAL PO LIQD: 1000.0000 mL | ORAL | Status: DC

## 2018-12-16 MED ORDER — MIDAZOLAM HCL 2 MG/2ML IJ SOLN: 2.0000 mg | INTRAMUSCULAR | Status: DC | PRN

## 2018-12-16 MED ORDER — ORAL CARE MOUTH RINSE
15.0000 mL | OROMUCOSAL | Status: DC
  Administered 2018-12-16 – 2018-12-23 (×70): 15 mL via OROMUCOSAL

## 2018-12-16 MED ORDER — 0.9 % SODIUM CHLORIDE (POUR BTL) OPTIME
TOPICAL | Status: DC | PRN
  Administered 2018-12-16 (×2): 1000 mL

## 2018-12-16 MED ORDER — BUPIVACAINE HCL (PF) 0.5 % IJ SOLN
INTRAMUSCULAR | Status: AC
  Filled 2018-12-16: qty 30

## 2018-12-16 MED ORDER — PROPOFOL 500 MG/50ML IV EMUL
INTRAVENOUS | Status: DC | PRN
  Administered 2018-12-16: 50 ug/kg/min via INTRAVENOUS

## 2018-12-16 MED ORDER — PANTOPRAZOLE SODIUM 40 MG PO PACK
40.0000 mg | PACK | ORAL | Status: DC
  Administered 2018-12-16 – 2018-12-22 (×7): 40 mg
  Filled 2018-12-16 (×8): qty 20

## 2018-12-16 MED ORDER — SODIUM CHLORIDE 0.9 % IV SOLN
INTRAVENOUS | Status: DC | PRN
  Administered 2018-12-16: 13:00:00 via INTRAVENOUS

## 2018-12-16 MED ORDER — DEXAMETHASONE SODIUM PHOSPHATE 10 MG/ML IJ SOLN
INTRAMUSCULAR | Status: AC
  Filled 2018-12-16: qty 1

## 2018-12-16 MED ORDER — THROMBIN 5000 UNITS EX SOLR
OROMUCOSAL | Status: DC | PRN
  Administered 2018-12-16: 5 mL via TOPICAL

## 2018-12-16 MED ORDER — OSMOLITE 1.2 CAL PO LIQD
1000.0000 mL | ORAL | Status: DC
  Administered 2018-12-17 – 2018-12-18 (×3): 1000 mL
  Filled 2018-12-16 (×4): qty 1000

## 2018-12-16 MED ORDER — FENTANYL CITRATE (PF) 100 MCG/2ML IJ SOLN
INTRAMUSCULAR | Status: DC | PRN
  Administered 2018-12-16 (×2): 75 ug via INTRAVENOUS

## 2018-12-16 MED ORDER — THROMBIN 5000 UNITS EX SOLR
CUTANEOUS | Status: AC
  Filled 2018-12-16: qty 5000

## 2018-12-16 MED ORDER — ONDANSETRON HCL 4 MG/2ML IJ SOLN
INTRAMUSCULAR | Status: AC
  Filled 2018-12-16: qty 2

## 2018-12-16 MED ORDER — CEFAZOLIN SODIUM-DEXTROSE 2-3 GM-%(50ML) IV SOLR
INTRAVENOUS | Status: DC | PRN
  Administered 2018-12-16: 2 g via INTRAVENOUS

## 2018-12-16 MED ORDER — SUCCINYLCHOLINE CHLORIDE 20 MG/ML IJ SOLN
INTRAMUSCULAR | Status: DC | PRN
  Administered 2018-12-16: 100 mg via INTRAVENOUS

## 2018-12-16 MED ORDER — LIDOCAINE 2% (20 MG/ML) 5 ML SYRINGE
INTRAMUSCULAR | Status: DC | PRN
  Administered 2018-12-16: 80 mg via INTRAVENOUS

## 2018-12-16 SURGICAL SUPPLY — 103 items
BENZOIN TINCTURE PRP APPL 2/3 (GAUZE/BANDAGES/DRESSINGS) IMPLANT
BLADE CLIPPER SURG (BLADE) ×3 IMPLANT
BLADE SAW GIGLI 16 STRL (MISCELLANEOUS) IMPLANT
BLADE SURG 15 STRL LF DISP TIS (BLADE) IMPLANT
BLADE SURG 15 STRL SS (BLADE)
BLADE ULTRA TIP 2M (BLADE) ×3 IMPLANT
BNDG GAUZE ELAST 4 BULKY (GAUZE/BANDAGES/DRESSINGS) IMPLANT
BNDG STRETCH 4X75 STRL LF (GAUZE/BANDAGES/DRESSINGS) IMPLANT
BUR ACORN 6.0 PRECISION (BURR) ×2 IMPLANT
BUR ACORN 6.0MM PRECISION (BURR) ×1
BUR ROUND FLUTED 4 SOFT TCH (BURR) IMPLANT
BUR ROUND FLUTED 4MM SOFT TCH (BURR)
BUR SPIRAL ROUTER 2.3 (BUR) ×2 IMPLANT
BUR SPIRAL ROUTER 2.3MM (BUR) ×1
CANISTER SUCT 3000ML PPV (MISCELLANEOUS) ×6 IMPLANT
CARTRIDGE OIL MAESTRO DRILL (MISCELLANEOUS) ×1 IMPLANT
CATH VENTRIC 35X38 W/TROCAR LG (CATHETERS) IMPLANT
CLIP VESOCCLUDE MED 6/CT (CLIP) IMPLANT
CONT SPEC 4OZ CLIKSEAL STRL BL (MISCELLANEOUS) ×3 IMPLANT
COVER MAYO STAND STRL (DRAPES) IMPLANT
COVER WAND RF STERILE (DRAPES) ×3 IMPLANT
DECANTER SPIKE VIAL GLASS SM (MISCELLANEOUS) ×3 IMPLANT
DERMABOND ADVANCED (GAUZE/BANDAGES/DRESSINGS) ×2
DERMABOND ADVANCED .7 DNX12 (GAUZE/BANDAGES/DRESSINGS) ×1 IMPLANT
DIFFUSER DRILL AIR PNEUMATIC (MISCELLANEOUS) ×3 IMPLANT
DRAIN SUBARACHNOID (WOUND CARE) IMPLANT
DRAPE HALF SHEET 40X57 (DRAPES) ×3 IMPLANT
DRAPE MICROSCOPE LEICA (MISCELLANEOUS) IMPLANT
DRAPE NEUROLOGICAL W/INCISE (DRAPES) ×3 IMPLANT
DRAPE STERI IOBAN 125X83 (DRAPES) IMPLANT
DRAPE SURG 17X23 STRL (DRAPES) IMPLANT
DRAPE WARM FLUID 44X44 (DRAPE) ×3 IMPLANT
DRSG ADAPTIC 3X8 NADH LF (GAUZE/BANDAGES/DRESSINGS) IMPLANT
DRSG TELFA 3X8 NADH (GAUZE/BANDAGES/DRESSINGS) ×3 IMPLANT
DURAGUARD 06CMX08CM ×6 IMPLANT
DURAPREP 26ML APPLICATOR (WOUND CARE) ×6 IMPLANT
DURAPREP 6ML APPLICATOR 50/CS (WOUND CARE) IMPLANT
ELECT REM PT RETURN 9FT ADLT (ELECTROSURGICAL) ×3
ELECTRODE REM PT RTRN 9FT ADLT (ELECTROSURGICAL) ×1 IMPLANT
EVACUATOR 1/8 PVC DRAIN (DRAIN) IMPLANT
EVACUATOR SILICONE 100CC (DRAIN) IMPLANT
FORCEPS BIPOLAR SPETZLER 8 1.0 (NEUROSURGERY SUPPLIES) IMPLANT
GAUZE 4X4 16PLY RFD (DISPOSABLE) IMPLANT
GAUZE SPONGE 4X4 12PLY STRL (GAUZE/BANDAGES/DRESSINGS) ×3 IMPLANT
GLOVE BIO SURGEON STRL SZ 6.5 (GLOVE) ×2 IMPLANT
GLOVE BIO SURGEON STRL SZ7 (GLOVE) ×6 IMPLANT
GLOVE BIO SURGEONS STRL SZ 6.5 (GLOVE) ×1
GLOVE BIOGEL PI IND STRL 7.0 (GLOVE) IMPLANT
GLOVE BIOGEL PI IND STRL 7.5 (GLOVE) ×1 IMPLANT
GLOVE BIOGEL PI INDICATOR 7.0 (GLOVE)
GLOVE BIOGEL PI INDICATOR 7.5 (GLOVE) ×2
GLOVE ECLIPSE 7.0 STRL STRAW (GLOVE) ×6 IMPLANT
GLOVE EXAM NITRILE XL STR (GLOVE) IMPLANT
GOWN STRL REUS W/ TWL LRG LVL3 (GOWN DISPOSABLE) ×2 IMPLANT
GOWN STRL REUS W/ TWL XL LVL3 (GOWN DISPOSABLE) IMPLANT
GOWN STRL REUS W/TWL 2XL LVL3 (GOWN DISPOSABLE) IMPLANT
GOWN STRL REUS W/TWL LRG LVL3 (GOWN DISPOSABLE) ×4
GOWN STRL REUS W/TWL XL LVL3 (GOWN DISPOSABLE)
GRAFT DURAGEN MATRIX 5WX7L (Graft) ×3 IMPLANT
HEMOSTAT POWDER KIT SURGIFOAM (HEMOSTASIS) ×3 IMPLANT
HEMOSTAT SURGICEL 2X14 (HEMOSTASIS) ×3 IMPLANT
HOOK DURA 1/2IN (MISCELLANEOUS) IMPLANT
IV NS 1000ML (IV SOLUTION) ×2
IV NS 1000ML BAXH (IV SOLUTION) ×1 IMPLANT
KIT BASIN OR (CUSTOM PROCEDURE TRAY) ×3 IMPLANT
KIT DRAIN CSF ACCUDRAIN (MISCELLANEOUS) IMPLANT
KIT TURNOVER KIT B (KITS) ×3 IMPLANT
KNIFE ARACHNOID DISP AM-24-S (MISCELLANEOUS) ×3 IMPLANT
NEEDLE HYPO 22GX1.5 SAFETY (NEEDLE) ×3 IMPLANT
NEEDLE SPNL 18GX3.5 QUINCKE PK (NEEDLE) IMPLANT
NS IRRIG 1000ML POUR BTL (IV SOLUTION) ×9 IMPLANT
OIL CARTRIDGE MAESTRO DRILL (MISCELLANEOUS) ×3
PACK CRANIOTOMY CUSTOM (CUSTOM PROCEDURE TRAY) ×3 IMPLANT
PATTIES SURGICAL .25X.25 (GAUZE/BANDAGES/DRESSINGS) IMPLANT
PATTIES SURGICAL .5 X.5 (GAUZE/BANDAGES/DRESSINGS) IMPLANT
PATTIES SURGICAL .5 X3 (DISPOSABLE) IMPLANT
PATTIES SURGICAL 1/4 X 3 (GAUZE/BANDAGES/DRESSINGS) IMPLANT
PATTIES SURGICAL 1X1 (DISPOSABLE) IMPLANT
PIN MAYFIELD SKULL DISP (PIN) ×3 IMPLANT
RUBBERBAND STERILE (MISCELLANEOUS) IMPLANT
SET TUBING W/EXT DISP (INSTRUMENTS) IMPLANT
SPECIMEN JAR SMALL (MISCELLANEOUS) IMPLANT
SPONGE NEURO XRAY DETECT 1X3 (DISPOSABLE) IMPLANT
SPONGE SURGIFOAM ABS GEL 100 (HEMOSTASIS) ×3 IMPLANT
STAPLER VISISTAT 35W (STAPLE) ×6 IMPLANT
STOCKINETTE 6  STRL (DRAPES)
STOCKINETTE 6 STRL (DRAPES) IMPLANT
SUT ETHILON 3 0 FSL (SUTURE) IMPLANT
SUT ETHILON 3 0 PS 1 (SUTURE) IMPLANT
SUT NURALON 4 0 TR CR/8 (SUTURE) ×3 IMPLANT
SUT SILK 0 TIES 10X30 (SUTURE) IMPLANT
SUT VIC AB 0 CT1 18XCR BRD8 (SUTURE) ×5 IMPLANT
SUT VIC AB 0 CT1 8-18 (SUTURE) ×10
SUT VIC AB 3-0 SH 8-18 (SUTURE) ×6 IMPLANT
TAPE CLOTH 1X10 TAN NS (GAUZE/BANDAGES/DRESSINGS) ×3 IMPLANT
TIP STRAIGHT 25KHZ (INSTRUMENTS) ×3 IMPLANT
TOWEL GREEN STERILE (TOWEL DISPOSABLE) ×3 IMPLANT
TOWEL GREEN STERILE FF (TOWEL DISPOSABLE) ×3 IMPLANT
TRAY FOLEY MTR SLVR 16FR STAT (SET/KITS/TRAYS/PACK) ×3 IMPLANT
TUBE CONNECTING 12'X1/4 (SUCTIONS) ×1
TUBE CONNECTING 12X1/4 (SUCTIONS) ×2 IMPLANT
UNDERPAD 30X30 (UNDERPADS AND DIAPERS) ×3 IMPLANT
WATER STERILE IRR 1000ML POUR (IV SOLUTION) ×3 IMPLANT

## 2018-12-16 NOTE — Progress Notes (Signed)
PT Cancellation Note  Patient Details Name: Joshua Beck MRN: [id number] DOB: Jul 13, 1966   Cancelled Treatment:    Reason Eval/Treat Not Completed: Patient not medically ready;Patient obtunded and possible intervention today, will hold   Duncan Dull 12/15/2018, 10:31 AM

## 2018-12-16 NOTE — Transfer of Care (Signed)
Immediate Anesthesia Transfer of Care Note  Patient: Joshua Beck  Procedure(s) Performed: HEMICRANIECTOMY with placement of skull flap in abdomen (Left Head)  Patient Location: ICU  Anesthesia Type:General  Level of Consciousness: unresponsive and Patient remains intubated per anesthesia plan  Airway & Oxygen Therapy: Patient remains intubated per anesthesia plan and Patient placed on Ventilator (see vital sign flow sheet for setting)  Post-op Assessment: Report given to RN and Post -op Vital signs reviewed and stable  Post vital signs: Reviewed and stable  Last Vitals:  Vitals Value Taken Time  BP 183/107 11/25/2018  3:47 PM  Temp    Pulse 105 12/15/2018  3:51 PM  Resp 17 11/30/2018  3:51 PM  SpO2 97 % 11/29/2018  3:51 PM  Vitals shown include unvalidated device data.  Last Pain:  Vitals:   11/22/2018 1200  TempSrc: Axillary  PainSc:          Complications: No apparent anesthesia complications

## 2018-12-16 NOTE — Progress Notes (Addendum)
RN verified the presence of a signed informed consent that matches stated procedure by patient's family. Verified armband matches patient's stated name and birth date. Verified NPO status and that all jewelry, contact, glasses, dentures, and partials had been removed (if applicable).   CRNA at bedside. Patient sinus tach on monitor. SpO2 93% on 2 lpm, patient obtunded.   Per Caryl Pina, RN tube feeding stopped at 1000 today.

## 2018-12-16 NOTE — Progress Notes (Signed)
STROKE TEAM PROGRESS NOTE   INTERVAL HISTORY No family at bedside. Pt much drowsy and obtunded overnight. Hardly open eyes on voice and pain. Still withdraw to pain on the right but not following commands. CT showed significant left MCA infarct with MLS. Dr. Kathyrn Sheriff and I talked with daughter over the phone, will proceed with hemicrani this pm. TF on hold now.    Vitals:   11/23/2018 0500 12/12/2018 0600 12/17/2018 0630 11/23/2018 0700  BP: (!) 195/105 (!) 143/89  (!) 181/91  Pulse: (!) 112 94  96  Resp: (!) 27 20  20   Temp:   98.5 F (36.9 C)   TempSrc:   Oral   SpO2: 93% 93%  94%  Weight: 67.5 kg     Height:        CBC:  Recent Labs  Lab 12/14/18 1643 12/15/18 1600 12/14/2018 0500  WBC 33.4* 31.2* 30.5*  NEUTROABS 30.4* 28.1*  --   HGB 14.2 12.3* 12.7*  HCT 43.7 38.5* 40.2  MCV 100.2* 101.3* 101.3*  PLT 315 271 675    Basic Metabolic Panel:  Recent Labs  Lab 12/15/18 0220  12/15/18 1332  12/06/2018 0200 12/04/2018 0500  NA 159*   < > 141   < > 156* 144  K 3.4*  --  3.7  --   --  3.0*  CL >130*  --  107  --   --  110  CO2 24  --  27  --   --  25  GLUCOSE 129*  --  133*  --   --  167*  BUN 10  --  13  --   --  12  CREATININE 0.67  --  0.68  --   --  0.68  CALCIUM 7.1*  --  8.7*  --   --  8.4*  MG 1.8  --   --   --   --  2.3  PHOS  --   --  1.9*  --   --  1.4*   < > = values in this interval not displayed.   Lipid Panel:     Component Value Date/Time   CHOL 212 (H) 12/14/2018 0004   TRIG 59 12/14/2018 0004   HDL 50 12/14/2018 0004   CHOLHDL 4.2 12/14/2018 0004   VLDL 12 12/14/2018 0004   LDLCALC 150 (H) 12/14/2018 0004   HgbA1c:  Lab Results  Component Value Date   HGBA1C 6.1 (H) 12/14/2018   Urine Drug Screen: No results found for: LABOPIA, COCAINSCRNUR, LABBENZ, AMPHETMU, THCU, LABBARB  Alcohol Level No results found for: ETH  IMAGING  Ct Head Wo Contrast 11/28/2018 IMPRESSION:  1. Worsening cerebral edema with rightward midline shift now measuring 14  mm.  2. Subfalcine herniation of the left cingulate gyrus.  3. No acute hemorrhage.     Mr Brain Wo Contrast 12/14/2018 IMPRESSION:  1. Large confluent evolving acute ischemic left MCA territory infarct. Associated extensive cytotoxic edema with developing regional mass effect and new 3 mm left-to-right midline shift. No hydrocephalus or ventricular trapping. Associated scattered petechial hemorrhage within the area of infarction without frank hemorrhagic transformation.  2. Evidence for occlusive thrombus throughout the left middle cerebral artery, stable from prior CTA. Abnormal flow void within the left ICA to the cavernous segment may reflect slow flow and/or occlusion.  3. Few additional subcentimeter ischemic infarcts within the right cerebral hemisphere as above, likely embolic.    Dg Chest Port 1 View 12/15/2018 IMPRESSION:  1. Mildly  increased interstitial opacity from two days ago could reflect atelectasis or pulmonary vascular congestion.  2. No other acute cardiopulmonary abnormality.  3. Left PICC line placed, tip at the cavoatrial junction level.    Dg Swallowing Func-speech Pathology 12/15/2018 Diet Recommendations  NPO; Alternative means - temporary Liquid Administration via -- Medication Administration Via alternative means           [place] Ekg Site Rite 12/14/2018 If Site Rite image not attached, placement could not be confirmed due to current cardiac rhythm.  Transthoracic Echocardiogram  12/14/2018 Study Conclusions - Left ventricle: The cavity size was normal. Systolic function was   normal. The estimated ejection fraction was in the range of 60%   to 65%. Wall motion was normal; there were no regional wall   motion abnormalities. Left ventricular diastolic function   parameters were normal. - Atrial septum: No defect or patent foramen ovale was identified. Impressions: - Normal study. No cardiac source of emboli was indentified.   PHYSICAL EXAM  Temp:   [98.4 F (36.9 C)-99.8 F (37.7 C)] 98.4 F (36.9 C) (01/25 0800) Pulse Rate:  [92-114] 92 (01/25 0800) Resp:  [17-27] 21 (01/25 0800) BP: (143-195)/(80-110) 155/91 (01/25 0800) SpO2:  [93 %-99 %] 94 % (01/25 0800) Weight:  [67.5 kg] 67.5 kg (01/25 0500)  General - Well nourished, well developed, drowsy and obtunded.  Ophthalmologic - fundi not visualized due to noncooperation.  Cardiovascular - Regular rate and rhythm, but tachycardia.  Neuro - drowsy and obtunded, eyes open with strong pain, but not following commands. With forced eye opening, eyes in left gaze position, not cross midline even with doll's eye maneuver, not blinking to visual threat, not tracking, PERRL. Corneal reflex present, gag and cough present. Breathing over the vent.  Facial symmetry not able to test due to ET tube.  Tongue midline in mouth. On pain stimulation, withdraw in all extremities but L>R. DTR 1+ and babinski on the right. Sensation, coordination and gait not tested.  NIH Stroke Scale  Level Of Consciousness 0=Alert; keenly responsive 1=Not alert, but arousable by minor stimulation 2=Not alert, requires repeated stimulation 3=Responds only with reflex movements 2  LOC Questions to Month and Age 72=Answers both questions correctly 1=Answers one question correctly 2=Answers neither question correctly 2  LOC Commands      -Open/Close eyes     -Open/close grip 0=Performs both tasks correctly 1=Performs one task correctly 2=Performs neighter task correctly 2  Best Gaze 0=Normal 1=Partial gaze palsy 2=Forced deviation, or total gaze paresis 2  Visual 0=No visual loss 1=Partial hemianopia 2=Complete hemianopia 3=Bilateral hemianopia (blind including cortical blindness) 2  Facial Palsy 0=Normal symmetrical movement 1=Minor paralysis (asymmetry) 2=Partial paralysis (lower face) 3=Complete paralysis (upper and lower face) 2  Motor  0=No drift, limb holds posture for full 10 seconds 1=Drift, limb  holds posture, no drift to bed 2=Some antigravity effort, cannot maintain posture, drifts to bed 3=No effort against gravity, limb falls 4=No movement Right Arm 3     Leg 3    Left Arm 2     Leg 3  Limb Ataxia 0=Absent 1=Present in one limb 2=Present in two limbs 0  Sensory 0=Normal 1=Mild to moderate sensory loss 2=Severe to total sensory loss 1  Best Language 0=No aphasia, normal 1=Mild to moderate aphasia 2=Mute, global aphasia 3=Mute, global aphasia 3  Dysarthria 0=Normal 1=Mild to moderate 2=Severe, unintelligible or mute/anarthric 2  Extinction/Neglect 0=No abnormality 1=Extinction to bilateral simultaneous stimulation 2=Profound neglect 2  Total   31  ASSESSMENT/PLAN Mr. JANIEL CRISOSTOMO is a 53 y.o. 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 Not a candidate for TPA d/t presenting outside of TPA window. Not a candidate for IR given core of 194 ml.  Enrolled in the CHARM trial (IV glyburide versus placebo for cerebral edema).  Stroke: large left MCA infarct secondary with cytotoxic cerebral edema and petechial hemorrhage - embolic - source unknown, could be due to bilateral ICA high grade stenosis/occlusion - enrolled in CHARM  CT head no acute hemorrhage; large area of acute ischemia in the left MCA territory with hyperdense left MCA. Aspects 3.  CTA head & neck Complete occlusion of the left middle cerebral artery with no collateralization. Diffuse narrowing of the left carotid system, with short segment occlusion of the distal left common carotid artery.70% stenosis of the right internal carotid artery at the bifurcation.   CT perfusion: Large core infarct of 194 mL in the left MCA territory.  Repeat CT head with evolving large left MCA territory infarct there which has now propagated  to the left basal ganglia   MRI large evolving left MCA territory infarct with associated extensive cytotoxic edema with developing regional mass-effect. Few additional right cerebral hemisphere subcentimeter infarcts, likely embolic  2D Echo  - EF 60 - 65%. No cardiac source of emboli identified.   LDL 150  HgbA1c 6.1  UDS pending  SCD's for VTE prophylaxis, consider subq heparin after hemi-crani   aspirin 81 mg daily prior to admission, now on Aspirin suppository 300 mg  Therapy recommendations:  pending  Disposition:  pending  Cerebral edema  Induced hypernatremia Enrolled in CHARM trial (IV glyburide versus placebo for cerebral edema)  CT repeat 12/19/2018 - significant MLS  NSG Dr. Kathyrn Sheriff consulted and will proceed with hemi-crani  on 3% protocol for cerebral edema control  Sodium 156->158->147 ??  On 3% at 75 mLs an hour  Na goal 150-155  Check NA q6h  PICC line placement  NSG consulted   Carotid stenosis  CTA head neck - Diffuse narrowing of the left carotid system, with short segment occlusion of the distal left common carotid artery.70% stenosis of the right internal carotid artery at the bifurcation.  On ASA  Respiratory failure  Due to decreased LOC  Intubated for airway protection and for surgery   CCM on board  Continue ventilation  Severe leukocytosis  WBC 33.4->31.2->30.5  Neutrophil 90-91%  Needs outpt follow up with hematology  Reactive vs. Leukemia   Hypertension  Stable now . Permissive hypertension (OK if < 180/105) but gradually normalize in 5-7 days . Long-term BP goal normotensive  Hyperlipidemia  Home meds:  none  LDL 150, goal < 70  Add atorvastatin 40  Continue statin at discharge  Other Stroke Risk Factors  Cigarette smoker advised to stop smoking  Hx stroke - 2011 without residue  Other Active Problems  Hypokalemia 3.4- replace   This patient is critically ill and at significant risk of  neurological worsening, death and care requires constant monitoring of vital signs, hemodynamics,respiratory and cardiac monitoring, extensive review of multiple databases, frequent neurological assessment, discussion with family, other specialists and medical decision making of high complexity. I spent 45 minutes of neurocritical care time  in the care of  this patient. I also discussed with daughter over the  phone and with Dr. Kathyrn Sheriff at bedside.   Rosalin Hawking, MD PhD Stroke Neurology 12/01/2018 11:45 PM   To contact Stroke Continuity provider, please refer to [email or web address] After hours, contact General Neurology

## 2018-12-16 NOTE — Anesthesia Preprocedure Evaluation (Addendum)
Anesthesia Evaluation  Patient identified by MRN, date of birth, ID band Patient awake and Patient unresponsive    Reviewed: Allergy & Precautions, NPO status , Patient's Chart, lab work & pertinent test results  Airway Mallampati: II  TM Distance: <3 FB Neck ROM: Full    Dental  (+) Dental Advisory Given, Poor Dentition, Missing   Pulmonary  H/o lung cancer   Pulmonary exam normal breath sounds clear to auscultation       Cardiovascular Exercise Tolerance: Good hypertension, Pt. on medications Normal cardiovascular exam Rhythm:Regular Rate:Normal     Neuro/Psych Repeat head CT this am demonstrates significant increase in left hemispheric edema with increased effacement of left lateral ventricle and MLS. There is worsened transtentorial herniation and compression of the midbrain. CVA (s/p large LMCA territory stroke ), Residual Symptoms    GI/Hepatic negative GI ROS, Neg liver ROS,   Endo/Other  diabetes, Type 2  Renal/GU negative Renal ROS     Musculoskeletal negative musculoskeletal ROS (+)   Abdominal   Peds  Hematology negative hematology ROS (+) Blood dyscrasia, anemia ,   Anesthesia Other Findings Day of surgery medications reviewed with the patient.  Reproductive/Obstetrics                            Anesthesia Physical Anesthesia Plan  ASA: IV  Anesthesia Plan: General   Post-op Pain Management:    Induction: Intravenous, Rapid sequence and Cricoid pressure planned  PONV Risk Score and Plan: 2 and Ondansetron and Dexamethasone  Airway Management Planned: Oral ETT and Video Laryngoscope Planned  Additional Equipment:   Intra-op Plan:   Post-operative Plan: Possible Post-op intubation/ventilation  Informed Consent: I have reviewed the patients History and Physical, chart, labs and discussed the procedure including the risks, benefits and alternatives for the proposed  anesthesia with the patient or authorized representative who has indicated his/her understanding and acceptance.   Patient has DNR.  Discussed DNR with power of attorney and Continue DNR.   Dental advisory given and Consent reviewed with POA  Plan Discussed with: CRNA  Anesthesia Plan Comments:        Anesthesia Quick Evaluation

## 2018-12-16 NOTE — Anesthesia Procedure Notes (Signed)
Procedure Name: Intubation Date/Time: 12/20/2018 1:39 PM Performed by: Moshe Salisbury, CRNA Pre-anesthesia Checklist: Patient identified, Emergency Drugs available, Suction available and Patient being monitored Patient Re-evaluated:Patient Re-evaluated prior to induction Oxygen Delivery Method: Circle System Utilized Preoxygenation: Pre-oxygenation with 100% oxygen Induction Type: IV induction Ventilation: Mask ventilation without difficulty Laryngoscope Size: Glidescope and 4 Grade View: Grade II Tube type: Oral Number of attempts: 1 Airway Equipment and Method: Rigid stylet and Video-laryngoscopy Placement Confirmation: ETT inserted through vocal cords under direct vision,  positive ETCO2 and breath sounds checked- equal and bilateral Secured at: 23 cm Tube secured with: Tape Dental Injury: Teeth and Oropharynx as per pre-operative assessment  Difficulty Due To: Difficulty was anticipated, Difficult Airway- due to reduced neck mobility and Difficult Airway-  due to edematous airway Future Recommendations: Recommend- induction with short-acting agent, and alternative techniques readily available

## 2018-12-16 NOTE — Consult Note (Signed)
NAME:  Joshua Beck, MRN:  [id number], DOB:  [birth year], LOS: 3 ADMISSION DATE:  11/23/2018, CONSULTATION DATE:  12/15/2018 REFERRING MD:  Neuro Leonie Man, CHIEF COMPLAINT:  Respiratory failure and electrolytes abrnomalities   Brief History   53 yo male presented with aphasia with Rt side weakness.  Found to have large Lt MCA ischemia CVA and started on 3% saline.  Past Medical History  CVA, Carotid artery stenosis, Oropharyngeal cancer  Significant Hospital Events   1/22 Admit 1/25 decompressive craniectomy  Consults:  PCCM NS  Procedures:  ETT 1/25 >>   Significant Diagnostic Tests:  MRI brain 1/23 >> large acute ischemic CVA LT MCA territory, cytotoxic edema, 3 mm Lt to Rt shift, occlusive thrombus Lt MCA CT head 1/25 >> worsening cerebral edema with 14 mm shift, subfalcine herniation of Lt cingulate gyrus  Micro Data:    Antimicrobials:    Interim history/subjective:  Intubated for surgery.  Objective   Blood pressure 140/88, pulse 95, temperature 97.8 F (36.6 C), temperature source Axillary, resp. rate 14, height 6' (1.829 m), weight 67.5 kg, SpO2 100 %.    Vent Mode: PRVC FiO2 (%):  [80 %] 80 % Set Rate:  [14 bmp] 14 bmp Vt Set:  [620 mL] 620 mL PEEP:  [5 cmH20] 5 cmH20 Plateau Pressure:  [20 cmH20] 20 cmH20   Intake/Output Summary (Last 24 hours) at 12/17/2018 1711 Last data filed at 11/30/2018 1600 Gross per 24 hour  Intake 2578.75 ml  Output 2725 ml  Net -146.25 ml   Filed Weights   12/02/2018 2117 12/19/2018 0500  Weight: 69.6 kg 67.5 kg    Examination:  General - sedated Eyes - pupils pinpoint ENT - ETT in place Cardiac - regular rate/rhythm, no murmur Chest - equal breath sounds b/l, no wheezing or rales Abdomen - soft, non tender, + bowel sounds GU - no lesions noted Extremities - no cyanosis, clubbing, or edema Skin - no rashes Neuro - sedated    Resolved Hospital Problem list     Assessment & Plan:   Acute respiratory failure  with compromised airway. Plan - full vent support - f/u CXR, ABG  Lt MCA ischemic CVA with cerebral edema s/p decompressive craniectomy. Plan - continue 3% saline - RASS goal 0 to -1  Hypokalemia. Hypophosphatemia. Plan - f/u BMET  Leukocytosis. Plan - f/u CBC  Hyperglycemia. Plan - SSI  Best practice:  DVT prophylaxis - SCDs SUP - protonix Nutrition - tube feeds Mobility - bed rest Goals of care - no CPR, no defibrillation Family discussions - updated daughter at bedside   Labs    CMP Latest Ref Rng & Units 12/22/2018 12/15/2018 12/09/2018  Glucose 70 - 99 mg/dL - - 167(H)  BUN 6 - 20 mg/dL - - 12  Creatinine 0.61 - 1.24 mg/dL - - 0.68  Sodium 135 - 145 mmol/L 156(H) 159(H) 144  Potassium 3.5 - 5.1 mmol/L - - 3.0(L)  Chloride 98 - 111 mmol/L - - 110  CO2 22 - 32 mmol/L - - 25  Calcium 8.9 - 10.3 mg/dL - - 8.4(L)  Total Protein 6.5 - 8.1 g/dL - - -  Total Bilirubin 0.3 - 1.2 mg/dL - - -  Alkaline Phos 38 - 126 U/L - - -  AST 15 - 41 U/L - - -  ALT 0 - 44 U/L - - -   CBC Latest Ref Rng & Units 12/15/2018 12/15/2018 12/14/2018  WBC 4.0 - 10.5 K/uL 30.5(H) 31.2(H) 33.4(H)  Hemoglobin 13.0 - 17.0 g/dL 12.7(L) 12.3(L) 14.2  Hematocrit 39.0 - 52.0 % 40.2 38.5(L) 43.7  Platelets 150 - 400 K/uL 283 271 315   ABG No results found for: PHART, PCO2ART, PO2ART, HCO3, TCO2, ACIDBASEDEF, O2SAT  CBG (last 3)  Recent Labs    12/08/2018 0805 11/27/2018 1137 11/26/2018 1600  GLUCAP 132* 115* 123*    CC time 33 minutes  Chesley Mires, MD [place] 12/15/2018, 5:21 PM

## 2018-12-16 NOTE — Progress Notes (Signed)
  NEUROSURGERY PROGRESS NOTE   No issues overnight. Pt noted to be less responsive today compared to yesterday. Has increased secretions.  EXAM:  BP (!) 155/91   Pulse 92   Temp 98.4 F (36.9 C) (Axillary)   Resp (!) 21   Ht 6' (1.829 m)   Wt 67.5 kg   SpO2 94%   BMI 20.18 kg/m   Minimal eye opening to voice On  Not following commands, Will hold LUE above bed  W/d LUE/LLE  IMAGING: Repeat head CT this am reviewed, demonstrates significant increase in left hemispheric edema with increased effacement of left lateral ventricle and MLS. There is worsened transtentorial herniation and compression of the midbrain.  IMPRESSION:  53 y.o. male s/p large LMCA territory stroke with worsening clinical exam and worsening edema with radiographic brainstem compression.  PLAN: - Will proceed with left decompressive hemicraniectomy  I have reviewed the situation with Dr. Erlinda Hong (neurology) and we both feel surgical decompression is indicated in this patient given his premorbid condition. I have reviewed the indications with the patient's daughter including risks of the procedure such as bleeding, stroke, need for additional surgeries. We also specifically discussed the fact that decompressive surgery is intended to prevent worsening secondary injuries from edema rather than improving or accelerative recovery from the primary LMCA stroke. All her questions were answered and she provided consent to proceed.

## 2018-12-16 NOTE — Op Note (Signed)
NEUROSURGERY OPERATIVE NOTE   PREOP DIAGNOSIS:  1. Intracranial Hypertension 2. Left MCA stroke   POSTOP DIAGNOSIS: Same  PROCEDURE: 1. Left decompressive hemicraniectomy 2. Placement of bone flap in abdominal subcutaneous pocket  SURGEON: Dr. Consuella Lose, MD  ASSISTANT: Ferne Reus, PA-C  ANESTHESIA: General Endotracheal  EBL: 75cc  SPECIMENS: None  DRAINS: None  COMPLICATIONS: None immediate  CONDITION: Intubated, hemodynamically stable to ICU  HISTORY: Joshua Beck is a 53 y.o. male admitted to the hospital with right-sided weakness.  He was found to have a large MCA territory stroke.  He has been monitored in the intensive care unit with progressive decline in mental status.  Serial radiographic imaging has demonstrated progressive edema with mass-effect and midline shift progression.  After discussion with both neurology service and the patient's family, he appeared to be a good candidate for decompressive hemicraniectomy.  The risks and benefits of the surgery were reviewed in detail with the patient's family.  After all questions were answered informed consent was obtained and witnessed.  PROCEDURE IN DETAIL: The patient was brought to the operating room. After induction of general anesthesia, the patient was positioned on the operative table in the supine position. All pressure points were meticulously padded.  Large left-sided reverse?  Skin incision was then marked out and prepped and draped in the usual sterile fashion.  Skin of the abdomen was also prepped and draped in the usual sterile fashion.  After timeout was conducted, the scalp incision was infiltrated with local anesthetic with epinephrine.  Incision was then made sharply and carried down through the galea.  Raney clips were applied for hemostasis.  Temporalis fascia and the temporalis muscle were then incised.  A single piece myocutaneous flap was then elevated and reflected anteriorly.   High-speed drill was then used to create a large frontotemporoparietal craniotomy flap.  Flap was elevated and stored sterilely on the back table.  Hemostasis was then secured on the epidural surface with bipolar electrocautery and morselized Gelfoam and thrombin.  Leksell rongeurs were then used to remove some bone of the lesser wing of the sphenoid, as well as removed portion of the squamosal temporal bone in order to provide good temporal decompression.  Hemostasis on the bone was then secured with bone wax.  At this point the dura was opened in stellate fashion and the underlying brain was noted to be somewhat dusky, but under significant tension.  The brain did appear to herniate out the craniotomy defect to the level of approximately the outer table of bone.  Hemostasis was secured easily with bipolar electrocautery and morselized Gelfoam and thrombin.  Dural leaflets were then placed over the brain surface.  This was covered with a layer of bovine pericardium, and another layer of collagen onlay graft.  The skin flap and muscle were then reapproximated with interrupted 0 Vicryl stitches.  Skin staples were placed.  Attention was then turned to placement of the bone flap within the abdominal subcutaneous pocket.  Linear incision in the right lower quadrant extending from the midline towards the right was infiltrated with local anesthetic with epinephrine.  Incision was then made sharply.  Bovie electrocautery was used to dissect through subcutaneous tissue and fat until the rectus fascia was identified.  Subcutaneous pocket was then created.  The bone flap was then placed within the pocket after hemostasis was secured with electrocautery.  Wound was then irrigated with saline.  Subcutaneous layer was then closed with interrupted 0 Vicryl stitches.  Deep dermal  layer was closed with interrupted 3-0 Vicryl stitches.  Skin was closed with Dermabond.   At the end of the case all sponge, needle, and  instrument counts were correct.  Sterile Telfa dressing was then applied to the scalp wound.  The patient was then transferred to the stretcher, and taken to the neuro intensive care unit in stable hemodynamic condition.

## 2018-12-16 NOTE — Anesthesia Postprocedure Evaluation (Signed)
Anesthesia Post Note  Patient: Joshua Beck  Procedure(s) Performed: HEMICRANIECTOMY with placement of skull flap in abdomen (Left Head)     Patient location during evaluation: SICU Anesthesia Type: General Level of consciousness: sedated Pain management: pain level controlled Vital Signs Assessment: post-procedure vital signs reviewed and stable Respiratory status: patient remains intubated per anesthesia plan Cardiovascular status: stable Postop Assessment: no apparent nausea or vomiting Anesthetic complications: no    Last Vitals:  Vitals:   11/25/2018 1900 12/15/2018 2000  BP: (!) 139/99 123/80  Pulse: 89 87  Resp: 17 17  Temp:  37.2 C  SpO2: 97% 95%    Last Pain:  Vitals:   11/30/2018 2000  TempSrc: Axillary  PainSc:                  Catalina Gravel

## 2018-12-16 NOTE — Progress Notes (Signed)
OT Cancellation Note  Patient Details Name: Joshua Beck MRN: [id number] DOB: 19-Jul-1966   Cancelled Treatment:    Reason Eval/Treat Not Completed: Patient not medically ready.  Will check back.  Lucille Passy, OTR/L Acute Rehabilitation Services Pager [phone] Office [phone]   Lucille Passy M 12/22/2018, 10:40 AM

## 2018-12-17 ENCOUNTER — Inpatient Hospital Stay (HOSPITAL_COMMUNITY): Payer: Medicare Other

## 2018-12-17 DIAGNOSIS — Z978 Presence of other specified devices: Secondary | ICD-10-CM

## 2018-12-17 DIAGNOSIS — Z9889 Other specified postprocedural states: Secondary | ICD-10-CM

## 2018-12-17 DIAGNOSIS — J9690 Respiratory failure, unspecified, unspecified whether with hypoxia or hypercapnia: Secondary | ICD-10-CM

## 2018-12-17 LAB — HEPATIC FUNCTION PANEL
ALT: 49 U/L — AB (ref 0–44)
AST: 43 U/L — ABNORMAL HIGH (ref 15–41)
Albumin: 2.3 g/dL — ABNORMAL LOW (ref 3.5–5.0)
Alkaline Phosphatase: 63 U/L (ref 38–126)
Bilirubin, Direct: 0.1 mg/dL (ref 0.0–0.2)
Indirect Bilirubin: 0.3 mg/dL (ref 0.3–0.9)
Total Bilirubin: 0.4 mg/dL (ref 0.3–1.2)
Total Protein: 5.5 g/dL — ABNORMAL LOW (ref 6.5–8.1)

## 2018-12-17 LAB — RETICULOCYTES
Immature Retic Fract: 12.8 % (ref 2.3–15.9)
RBC.: 3.25 MIL/uL — ABNORMAL LOW (ref 4.22–5.81)
Retic Count, Absolute: 50.1 10*3/uL (ref 19.0–186.0)
Retic Ct Pct: 1.5 % (ref 0.4–3.1)

## 2018-12-17 LAB — BASIC METABOLIC PANEL
Anion gap: 7 (ref 5–15)
BUN: 19 mg/dL (ref 6–20)
CO2: 24 mmol/L (ref 22–32)
Calcium: 8.1 mg/dL — ABNORMAL LOW (ref 8.9–10.3)
Chloride: 123 mmol/L — ABNORMAL HIGH (ref 98–111)
Creatinine, Ser: 0.82 mg/dL (ref 0.61–1.24)
GFR calc Af Amer: >60 mL/min (ref >60)
GFR calc non Af Amer: >60 mL/min (ref >60)
Glucose, Bld: 192 mg/dL — ABNORMAL HIGH (ref 70–99)
Potassium: 3.4 mmol/L — ABNORMAL LOW (ref 3.5–5.1)
Sodium: 154 mmol/L — ABNORMAL HIGH (ref 135–145)

## 2018-12-17 LAB — GLUCOSE, CAPILLARY
Glucose-Capillary: 108 mg/dL — ABNORMAL HIGH (ref 70–99)
Glucose-Capillary: 113 mg/dL — ABNORMAL HIGH (ref 70–99)
Glucose-Capillary: 120 mg/dL — ABNORMAL HIGH (ref 70–99)
Glucose-Capillary: 152 mg/dL — ABNORMAL HIGH (ref 70–99)
Glucose-Capillary: 192 mg/dL — ABNORMAL HIGH (ref 70–99)
Glucose-Capillary: 94 mg/dL (ref 70–99)

## 2018-12-17 LAB — CBC WITH DIFFERENTIAL/PLATELET
Abs Immature Granulocytes: 0.14 10*3/uL — ABNORMAL HIGH (ref 0.00–0.07)
Basophils Absolute: 0 10*3/uL (ref 0.0–0.1)
Basophils Relative: 0 %
Eosinophils Absolute: 0 10*3/uL (ref 0.0–0.5)
Eosinophils Relative: 0 %
HCT: 33.9 % — ABNORMAL LOW (ref 39.0–52.0)
Hemoglobin: 10.3 g/dL — ABNORMAL LOW (ref 13.0–17.0)
Immature Granulocytes: 1 %
Lymphocytes Relative: 4 %
Lymphs Abs: 0.6 10*3/uL — ABNORMAL LOW (ref 0.7–4.0)
MCH: 31.7 pg (ref 26.0–34.0)
MCHC: 30.4 g/dL (ref 30.0–36.0)
MCV: 104.3 fL — ABNORMAL HIGH (ref 80.0–100.0)
MONO ABS: 1.8 10*3/uL — AB (ref 0.1–1.0)
Monocytes Relative: 10 %
Neutro Abs: 15.7 10*3/uL — ABNORMAL HIGH (ref 1.7–7.7)
Neutrophils Relative %: 85 %
Platelets: 249 10*3/uL (ref 150–400)
RBC: 3.25 MIL/uL — ABNORMAL LOW (ref 4.22–5.81)
RDW: 13.2 % (ref 11.5–15.5)
WBC: 18.3 10*3/uL — AB (ref 4.0–10.5)
nRBC: 0 % (ref 0.0–0.2)

## 2018-12-17 LAB — RAPID URINE DRUG SCREEN, HOSP PERFORMED
Amphetamines: NOT DETECTED
Barbiturates: NOT DETECTED
Benzodiazepines: NOT DETECTED
Cocaine: NOT DETECTED
Opiates: POSITIVE — AB
Tetrahydrocannabinol: NOT DETECTED

## 2018-12-17 LAB — GAMMA GT: GGT: 25 U/L (ref 7–50)

## 2018-12-17 LAB — CBC
HCT: 35.4 % — ABNORMAL LOW (ref 39.0–52.0)
Hemoglobin: 11.1 g/dL — ABNORMAL LOW (ref 13.0–17.0)
MCH: 32.6 pg (ref 26.0–34.0)
MCHC: 31.4 g/dL (ref 30.0–36.0)
MCV: 104.1 fL — AB (ref 80.0–100.0)
Platelets: 275 10*3/uL (ref 150–400)
RBC: 3.4 MIL/uL — ABNORMAL LOW (ref 4.22–5.81)
RDW: 13.2 % (ref 11.5–15.5)
WBC: 19.6 10*3/uL — ABNORMAL HIGH (ref 4.0–10.5)
nRBC: 0 % (ref 0.0–0.2)

## 2018-12-17 LAB — SODIUM
Sodium: 157 mmol/L — ABNORMAL HIGH (ref 135–145)
Sodium: 160 mmol/L — ABNORMAL HIGH (ref 135–145)
Sodium: 159 mmol/L — ABNORMAL HIGH (ref 135–145)

## 2018-12-17 LAB — PHOSPHORUS
Phosphorus: 1.6 mg/dL — ABNORMAL LOW (ref 2.5–4.6)
Phosphorus: 1.9 mg/dL — ABNORMAL LOW (ref 2.5–4.6)
Phosphorus: 1.9 mg/dL — ABNORMAL LOW (ref 2.5–4.6)

## 2018-12-17 LAB — URIC ACID: Uric Acid, Serum: 2.6 mg/dL — ABNORMAL LOW (ref 3.7–8.6)

## 2018-12-17 LAB — MAGNESIUM
Magnesium: 2.5 mg/dL — ABNORMAL HIGH (ref 1.7–2.4)
Magnesium: 2.1 mg/dL (ref 1.7–2.4)
Magnesium: 2.5 mg/dL — ABNORMAL HIGH (ref 1.7–2.4)

## 2018-12-17 MED ORDER — CHLORHEXIDINE GLUCONATE CLOTH 2 % EX PADS
6.0000 | MEDICATED_PAD | Freq: Every day | CUTANEOUS | Status: DC
  Administered 2018-12-18 – 2018-12-19 (×3): 6 via TOPICAL

## 2018-12-17 MED ORDER — HEPARIN SODIUM (PORCINE) 5000 UNIT/ML IJ SOLN
5000.0000 [IU] | Freq: Three times a day (TID) | INTRAMUSCULAR | Status: DC
  Administered 2018-12-17 – 2018-12-23 (×18): 5000 [IU] via SUBCUTANEOUS
  Filled 2018-12-17 (×18): qty 1

## 2018-12-17 MED ORDER — POTASSIUM CHLORIDE 20 MEQ/15ML (10%) PO SOLN
40.0000 meq | ORAL | Status: AC
  Administered 2018-12-17 (×2): 40 meq via ORAL
  Filled 2018-12-17 (×2): qty 30

## 2018-12-17 MED ORDER — INSULIN ASPART 100 UNIT/ML ~~LOC~~ SOLN
2.0000 [IU] | SUBCUTANEOUS | Status: DC
  Administered 2018-12-17: 4 [IU] via SUBCUTANEOUS
  Administered 2018-12-18 (×5): 2 [IU] via SUBCUTANEOUS
  Administered 2018-12-19: 4 [IU] via SUBCUTANEOUS
  Administered 2018-12-19: 2 [IU] via SUBCUTANEOUS
  Administered 2018-12-19: 4 [IU] via SUBCUTANEOUS
  Administered 2018-12-19: 2 [IU] via SUBCUTANEOUS
  Administered 2018-12-19: 4 [IU] via SUBCUTANEOUS
  Administered 2018-12-20 (×2): 2 [IU] via SUBCUTANEOUS
  Administered 2018-12-20: 4 [IU] via SUBCUTANEOUS
  Administered 2018-12-20 – 2018-12-23 (×14): 2 [IU] via SUBCUTANEOUS

## 2018-12-17 MED ORDER — LABETALOL HCL 5 MG/ML IV SOLN
5.0000 mg | INTRAVENOUS | Status: DC | PRN
  Administered 2018-12-18 (×2): 20 mg via INTRAVENOUS
  Administered 2018-12-19 – 2018-12-20 (×2): 10 mg via INTRAVENOUS
  Administered 2018-12-21: 20 mg via INTRAVENOUS
  Filled 2018-12-17 (×7): qty 4

## 2018-12-17 MED ORDER — ALBUTEROL SULFATE (2.5 MG/3ML) 0.083% IN NEBU
2.5000 mg | INHALATION_SOLUTION | RESPIRATORY_TRACT | Status: DC | PRN
  Administered 2018-12-20 – 2018-12-22 (×7): 2.5 mg via RESPIRATORY_TRACT
  Filled 2018-12-17 (×7): qty 3

## 2018-12-17 MED ORDER — CHLORHEXIDINE GLUCONATE CLOTH 2 % EX PADS: 6.0000 | MEDICATED_PAD | Freq: Every day | CUTANEOUS | Status: DC

## 2018-12-17 MED ORDER — ASPIRIN 81 MG PO CHEW
324.0000 mg | CHEWABLE_TABLET | Freq: Every day | ORAL | Status: DC
  Administered 2018-12-17 – 2018-12-23 (×7): 324 mg
  Filled 2018-12-17 (×7): qty 4

## 2018-12-17 NOTE — Progress Notes (Signed)
  NEUROSURGERY PROGRESS NOTE   No issues overnight.   EXAM:  BP (!) 169/102   Pulse 84   Temp 98.3 F (36.8 C) (Axillary)   Resp 17   Ht 6' (1.829 m)   Wt 67.5 kg   SpO2 97%   BMI 20.18 kg/m   Awakens easily to voice Breathing over vent Pupils reactive Purposeful with LUE/LLE, does more RLE spontaneously. Not FC Wound c/d/i  IMPRESSION:  53 y.o. male s/p LMCA stroke and POD#1 left hemicraniectomy. LOC appears significantly improved this am.  PLAN: - Cont supportive care - Cont hypertonic saline

## 2018-12-17 NOTE — Progress Notes (Signed)
NAME:  Joshua Beck, MRN:  [id number], DOB:  11-17-1966, LOS: 4 ADMISSION DATE:  12/04/2018, CONSULTATION DATE:  12/15/2018 REFERRING MD:  Neuro Leonie Man, CHIEF COMPLAINT:  Respiratory failure and electrolytes abrnomalities   Brief History   53 yo male presented with aphasia with Rt side weakness.  Found to have large Lt MCA ischemia CVA and started on 3% saline.  Past Medical History  CVA, Carotid artery stenosis, Oropharyngeal cancer  Significant Hospital Events   1/22 Admit 1/25 decompressive craniectomy  Consults:  PCCM NS  Procedures:  ETT 1/25 >>   Significant Diagnostic Tests:  MRI brain 1/23 >> large acute ischemic CVA LT MCA territory, cytotoxic edema, 3 mm Lt to Rt shift, occlusive thrombus Lt MCA CT head 1/25 >> worsening cerebral edema with 14 mm shift, subfalcine herniation of Lt cingulate gyrus  Micro Data:    Antimicrobials:    Interim history/subjective:  1/25>>Intubated for surgery. New fever 1/26 + 800 cc's Remains on 3% saline gtt  Objective   Blood pressure (!) 169/102, pulse 84, temperature 98.3 F (36.8 C), temperature source Axillary, resp. rate 17, height 6' (1.829 m), weight 67.5 kg, SpO2 97 %.    Vent Mode: PRVC FiO2 (%):  [40 %-80 %] 40 % Set Rate:  [14 bmp] 14 bmp Vt Set:  [620 mL] 620 mL PEEP:  [5 cmH20] 5 cmH20 Plateau Pressure:  [19 cmH20-21 cmH20] 19 cmH20   Intake/Output Summary (Last 24 hours) at 12/17/2018 1027 Last data filed at 12/17/2018 0600 Gross per 24 hour  Intake 414.41 ml  Output 1475 ml  Net -1060.59 ml   Filed Weights   12/07/2018 2117 12/15/2018 0500  Weight: 69.6 kg 67.5 kg    Examination:  General - Intubated and sedated, currently weaning on 10/5 Eyes - pupils 2-3 ,mm and reactive ENT - ETT in place, OG tube, MM pink and moist Cardiac - S1, S2, regular rate/rhythm, no murmur, rub, gallop Chest - equal breath sounds b/l, few wheezes . Rhonchi noted  No  Rales, copious secretions Abdomen - soft, non  tender,ND, + bowel sounds, Body mass index is 20.18 kg/m. Extremities - no cyanosis, clubbing, or edema Skin - no rashes, lesions or rash Neuro - sedated and intubated, follows no commands, moves bilateral lower extremities purposefully and left upper extremity. Right upper extremity with weaker movement noted    Resolved Hospital Problem list     Assessment & Plan:   Acute respiratory failure with compromised airway. Copious secretions No smoking history charted Plan - SBT as tolerated  - Wean oxygen and PEEP as able for saturations > 94% - f/u CXR,  - ABG prn - Will add prn albuterol as some wheezing on exam 1/26 - Minimize sedation  Lt MCA ischemic CVA with cerebral edema s/p decompressive craniectomy 1/25. Plan - continue 3% saline per neuro - RASS goal 0 to -1  Hypokalemia. Hypophosphatemia. Plan - f/u BMET - Will recheck mag and phos today as   Large fluctuation last 24 with no treatment  Leukocytosis.>> down trending but remains 19.6 T Max 99.4  Plan - f/u CBC - Trend fever curve - CXR now - Culture sputum - Culture Blood - Tylenol for fever > 101.5  Hyperglycemia. Plan - CBG Q 4 - SSI  Nutrition Plan: Tube Feeds  SUP  Best practice:  DVT prophylaxis - SCDs SUP - protonix Nutrition - tube feeds Mobility - bed rest Goals of care - no CPR, no defibrillation Family discussions -  updated daughter at bedside   Labs    CMP Latest Ref Rng & Units 12/17/2018 12/06/2018 11/23/2018  Glucose 70 - 99 mg/dL 192(H) - -  BUN 6 - 20 mg/dL 19 - -  Creatinine 0.61 - 1.24 mg/dL 0.82 - -  Sodium 135 - 145 mmol/L 154(H) 152(H) 147(H)  Potassium 3.5 - 5.1 mmol/L 3.4(L) - 3.4(L)  Chloride 98 - 111 mmol/L 123(H) - -  CO2 22 - 32 mmol/L 24 - -  Calcium 8.9 - 10.3 mg/dL 8.1(L) - -  Total Protein 6.5 - 8.1 g/dL - - -  Total Bilirubin 0.3 - 1.2 mg/dL - - -  Alkaline Phos 38 - 126 U/L - - -  AST 15 - 41 U/L - - -  ALT 0 - 44 U/L - - -   CBC Latest Ref Rng &  Units 12/17/2018 11/22/2018 12/02/2018  WBC 4.0 - 10.5 K/uL 19.6(H) - 30.5(H)  Hemoglobin 13.0 - 17.0 g/dL 11.1(L) 11.9(L) 12.7(L)  Hematocrit 39.0 - 52.0 % 35.4(L) 35.0(L) 40.2  Platelets 150 - 400 K/uL 275 - 283   ABG    Component Value Date/Time   PHART 7.389 12/03/2018 1724   PCO2ART 45.6 11/30/2018 1724   PO2ART 109.0 (H) 12/03/2018 1724   HCO3 27.7 12/06/2018 1724   TCO2 29 12/07/2018 1724   O2SAT 98.0 12/04/2018 1724    CBG (last 3)  Recent Labs    12/10/2018 2319 12/17/18 0334 12/17/18 0808  GLUCAP 237* 192* 113*    App CC time 35 minutes  Magdalen Spatz, AGACNP-BC [place] Pager # [phone] After 3 pm call [phone]  12/17/2018, 10:27 AM

## 2018-12-17 NOTE — Progress Notes (Signed)
Pt taken to CT and placed back on full vent support.  Pt tolerated well.  RT will continue to monitor.

## 2018-12-17 NOTE — Progress Notes (Signed)
STROKE TEAM PROGRESS NOTE   INTERVAL HISTORY No family at bedside. Pt still drowsy and obtunded.  Had left decompressive hemicraniectomy yesterday, intubated.  CT showed decreased midline shift however still has 11.5 mm shift.   Vitals:   12/17/18 0900 12/17/18 1000 12/17/18 1100 12/17/18 1200  BP: (!) 177/99 (!) 163/103 (!) 167/100 (!) 151/87  Pulse: 90 79 85 75  Resp: 11 14 18 15   Temp:    98.3 F (36.8 C)  TempSrc:    Axillary  SpO2: 95% 97% 98% 95%  Weight:      Height:        CBC:  Recent Labs  Lab 12/15/18 1600  12/17/18 0521 12/17/18 1033  WBC 31.2*   < > 19.6* 18.3*  NEUTROABS 28.1*  --   --  15.7*  HGB 12.3*   < > 11.1* 10.3*  HCT 38.5*   < > 35.4* 33.9*  MCV 101.3*   < > 104.1* 104.3*  PLT 271   < > 275 249   < > = values in this interval not displayed.    Basic Metabolic Panel:  Recent Labs  Lab 12/15/2018 0500  12/03/2018 1724  12/17/18 0521 12/17/18 1033 12/17/18 1156 12/17/18 1502  NA 144   < > 147*   < > 154*  --  157* 160*  K 3.0*  --  3.4*  --  3.4*  --   --   --   CL 110  --   --   --  123*  --   --   --   CO2 25  --   --   --  24  --   --   --   GLUCOSE 167*  --   --   --  192*  --   --   --   BUN 12  --   --   --  19  --   --   --   CREATININE 0.68  --   --   --  0.82  --   --   --   CALCIUM 8.4*  --   --   --  8.1*  --   --   --   MG 2.3   < >  --   --  2.5* 2.1  --  2.5*  PHOS 1.4*   < >  --   --  1.6* 1.9*  --  1.9*   < > = values in this interval not displayed.   Lipid Panel:     Component Value Date/Time   CHOL 212 (H) 12/14/2018 0004   TRIG 78 12/19/2018 1702   HDL 50 12/14/2018 0004   CHOLHDL 4.2 12/14/2018 0004   VLDL 12 12/14/2018 0004   LDLCALC 150 (H) 12/14/2018 0004   HgbA1c:  Lab Results  Component Value Date   HGBA1C 6.1 (H) 12/14/2018   Urine Drug Screen:     Component Value Date/Time   LABOPIA POSITIVE (A) 12/17/2018 1156   COCAINSCRNUR NONE DETECTED 12/17/2018 1156   LABBENZ NONE DETECTED 12/17/2018 1156    AMPHETMU NONE DETECTED 12/17/2018 1156   THCU NONE DETECTED 12/17/2018 1156   LABBARB NONE DETECTED 12/17/2018 1156    Alcohol Level No results found for: ETH  IMAGING  Ct Head Wo Contrast 12/05/2018 IMPRESSION:  1. Worsening cerebral edema with rightward midline shift now measuring 14 mm.  2. Subfalcine herniation of the left cingulate gyrus.  3. No acute hemorrhage.   Mr Brain  Wo Contrast 12/14/2018 IMPRESSION:  1. Large confluent evolving acute ischemic left MCA territory infarct. Associated extensive cytotoxic edema with developing regional mass effect and new 3 mm left-to-right midline shift. No hydrocephalus or ventricular trapping. Associated scattered petechial hemorrhage within the area of infarction without frank hemorrhagic transformation.  2. Evidence for occlusive thrombus throughout the left middle cerebral artery, stable from prior CTA. Abnormal flow void within the left ICA to the cavernous segment may reflect slow flow and/or occlusion.  3. Few additional subcentimeter ischemic infarcts within the right cerebral hemisphere as above, likely embolic.   Transthoracic Echocardiogram  12/14/2018 Study Conclusions - Left ventricle: The cavity size was normal. Systolic function was   normal. The estimated ejection fraction was in the range of 60%   to 65%. Wall motion was normal; there were no regional wall   motion abnormalities. Left ventricular diastolic function   parameters were normal. - Atrial septum: No defect or patent foramen ovale was identified. Impressions: - Normal study. No cardiac source of emboli was indentified.  Ct Head Wo Contrast  Result Date: 12/17/2018 CLINICAL DATA:  Left hemisphere stroke. Craniotomy for decompression. EXAM: CT HEAD WITHOUT CONTRAST TECHNIQUE: Contiguous axial images were obtained from the base of the skull through the vertex without intravenous contrast. COMPARISON:  12/03/2018 FINDINGS: Brain: Interval left frontoparietal  craniectomy for decompression. Extreme low-density in the complete left MCA territory consistent with completed infarction with swelling. No sign of hemorrhage. Subsequent to the craniectomy, there is less mass effect with left-to-right shift of 11.5 mm on this study compared with 15.5 mm on the previous scan. No ventricular trapping. No extra-axial collection. No new infarction. Vascular: No acute vascular finding. Hyperdense left MCA as seen previously. Skull: Left frontoparietal craniectomy as noted above. Sinuses/Orbits: Some inflammatory changes of the maxillary sinuses as noted previously. Orbits negative. Other: None IMPRESSION: Interval left frontoparietal craniectomy for decompression. Extreme low-density in the complete left MCA territory consistent with completed infarction with swelling. Decreased mass effect with left-to-right shift of 11.5 mm on this study compared with 15.5 mm on the previous scan. Electronically Signed   By: Nelson Chimes M.D.   On: 12/17/2018 17:41    PHYSICAL EXAM  Temp:  [98.3 F (36.8 C)-99.4 F (37.4 C)] 98.3 F (36.8 C) (01/26 1200) Pulse Rate:  [75-93] 75 (01/26 1200) Resp:  [11-19] 15 (01/26 1200) BP: (111-177)/(75-103) 151/87 (01/26 1200) SpO2:  [95 %-100 %] 95 % (01/26 1200) FiO2 (%):  [40 %-60 %] 50 % (01/26 1532)  General - Well nourished, well developed, drowsy and obtunded.  Ophthalmologic - fundi not visualized due to noncooperation.  Cardiovascular - Regular rate and rhythm, but tachycardia.  Neuro - drowsy and obtunded, eyes open with strong pain, but not following commands. With forced eye opening, eyes in left gaze position, not cross midline even with doll's eye maneuver, not blinking to visual threat, not tracking, PERRL. Corneal reflex present, gag and cough present. Breathing over the vent.  Facial symmetry not able to test due to ET tube.  Tongue midline in mouth. On pain stimulation, withdraw in all extremities but L>R. DTR 1+ and babinski  on the right. Sensation, coordination and gait not tested.   ASSESSMENT/PLAN Mr. Joshua Beck is a 53 y.o. 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 Not a  candidate for TPA d/t presenting outside of TPA window. Not a candidate for IR given core of 194 ml.  Enrolled in the CHARM trial (IV glyburide versus placebo for cerebral edema).  Stroke: large left MCA infarct secondary with cytotoxic cerebral edema and petechial hemorrhage - embolic - source unknown, could be due to bilateral ICA high grade stenosis/occlusion - enrolled in CHARM  CT head no acute hemorrhage; large area of acute ischemia in the left MCA territory with hyperdense left MCA. Aspects 3.  CTA head & neck Complete occlusion of the left middle cerebral artery with no collateralization. Diffuse narrowing of the left carotid system, with short segment occlusion of the distal left common carotid artery.70% stenosis of the right internal carotid artery at the bifurcation.   CT perfusion: Large core infarct of 194 mL in the left MCA territory.  Repeat CT head with evolving large left MCA territory infarct there which has now propagated to the left basal ganglia   MRI large evolving left MCA territory infarct with associated extensive cytotoxic edema with developing regional mass-effect. Few additional right cerebral hemisphere subcentimeter infarcts, likely embolic  2D Echo  - EF 60 - 65%. No cardiac source of emboli identified.   LDL 150  HgbA1c 6.1  UDS - positive for opiates (on MS Contin at home)  Heparin subq for VTE prophylaxis  aspirin 81 mg daily prior to admission, now on Aspirin 325 mg  Therapy recommendations:  pending  Disposition:  pending  Cerebral edema  Induced hypernatremia  CT repeat 11/28/2018 - significant MLS  NSG  Dr. Kathyrn Sheriff consulted and s/p hemi-crani 12/01/2018  CT head repeat 12/17/18 - improved MLS, but still has shift at 11.5 mm  on 3% protocol for cerebral edema control  Sodium 156->158->154->157->160  On 3% at 75 mLs an hour  Na goal 150-155  Check NA q6h  PICC line placement  NSG consulted   Carotid stenosis  CTA head neck - Diffuse narrowing of the left carotid system, with short segment occlusion of the distal left common carotid artery.70% stenosis of the right internal carotid artery at the bifurcation.  On ASA  Respiratory failure  Due to decreased LOC  Intubated for airway protection and for surgery   CCM on board  Continue ventilation  Severe leukocytosis  WBC 33.4->31.2->30.5->19.6->18.3  No fever  Likely reactive  Close monitoring  Hypertension  Stable now . Permissive hypertension (OK if < 180/105) but gradually normalize in 5-7 days . Long-term BP goal normotensive  Hyperlipidemia  Home meds:  none  LDL 150, goal < 70  Add atorvastatin 40  Continue statin at discharge  Other Stroke Risk Factors  Cigarette smoker advised to stop smoking  Hx stroke - 2011 without residue  Other Active Problems  Hypokalemia 3.4- replace->3.4 -> supplemented   This patient is critically ill and at significant risk of neurological worsening, death and care requires constant monitoring of vital signs, hemodynamics,respiratory and cardiac monitoring, extensive review of multiple databases, frequent neurological assessment, discussion with family, other specialists and medical decision making of high complexity. I spent 40 minutes of neurocritical care time  in the care of  this patient.  Rosalin Hawking, MD PhD Stroke Neurology 12/17/2018 6:34 PM   To contact Stroke Continuity provider, please refer to [email or web address] After hours, contact General Neurology

## 2018-12-18 ENCOUNTER — Inpatient Hospital Stay (HOSPITAL_COMMUNITY): Payer: Medicare Other

## 2018-12-18 ENCOUNTER — Encounter (HOSPITAL_COMMUNITY): Payer: Self-pay | Admitting: Neurosurgery

## 2018-12-18 DIAGNOSIS — J9600 Acute respiratory failure, unspecified whether with hypoxia or hypercapnia: Secondary | ICD-10-CM

## 2018-12-18 DIAGNOSIS — E785 Hyperlipidemia, unspecified: Secondary | ICD-10-CM

## 2018-12-18 DIAGNOSIS — R1312 Dysphagia, oropharyngeal phase: Secondary | ICD-10-CM

## 2018-12-18 LAB — GLUCOSE, CAPILLARY
Glucose-Capillary: 106 mg/dL — ABNORMAL HIGH (ref 70–99)
Glucose-Capillary: 120 mg/dL — ABNORMAL HIGH (ref 70–99)
Glucose-Capillary: 130 mg/dL — ABNORMAL HIGH (ref 70–99)
Glucose-Capillary: 131 mg/dL — ABNORMAL HIGH (ref 70–99)
Glucose-Capillary: 132 mg/dL — ABNORMAL HIGH (ref 70–99)
Glucose-Capillary: 134 mg/dL — ABNORMAL HIGH (ref 70–99)

## 2018-12-18 LAB — BASIC METABOLIC PANEL
Anion gap: 4 — ABNORMAL LOW (ref 5–15)
BUN: 23 mg/dL — ABNORMAL HIGH (ref 6–20)
CO2: 27 mmol/L (ref 22–32)
Calcium: 8.2 mg/dL — ABNORMAL LOW (ref 8.9–10.3)
Chloride: 126 mmol/L — ABNORMAL HIGH (ref 98–111)
Creatinine, Ser: 0.84 mg/dL (ref 0.61–1.24)
GFR calc Af Amer: >60 mL/min (ref >60)
GFR calc non Af Amer: >60 mL/min (ref >60)
GLUCOSE: 125 mg/dL — AB (ref 70–99)
POTASSIUM: 3.6 mmol/L (ref 3.5–5.1)
Sodium: 157 mmol/L — ABNORMAL HIGH (ref 135–145)

## 2018-12-18 LAB — CBC
HCT: 36.9 % — ABNORMAL LOW (ref 39.0–52.0)
Hemoglobin: 11.4 g/dL — ABNORMAL LOW (ref 13.0–17.0)
MCH: 32.2 pg (ref 26.0–34.0)
MCHC: 30.9 g/dL (ref 30.0–36.0)
MCV: 104.2 fL — ABNORMAL HIGH (ref 80.0–100.0)
PLATELETS: 287 10*3/uL (ref 150–400)
RBC: 3.54 MIL/uL — ABNORMAL LOW (ref 4.22–5.81)
RDW: 13.5 % (ref 11.5–15.5)
WBC: 22.9 10*3/uL — ABNORMAL HIGH (ref 4.0–10.5)
nRBC: 0 % (ref 0.0–0.2)

## 2018-12-18 LAB — MAGNESIUM: MAGNESIUM: 2.4 mg/dL (ref 1.7–2.4)

## 2018-12-18 LAB — PHOSPHORUS: PHOSPHORUS: 2.4 mg/dL — AB (ref 2.5–4.6)

## 2018-12-18 LAB — SODIUM
Sodium: 157 mmol/L — ABNORMAL HIGH (ref 135–145)
Sodium: 157 mmol/L — ABNORMAL HIGH (ref 135–145)

## 2018-12-18 MED ORDER — K PHOS MONO-SOD PHOS DI & MONO 155-852-130 MG PO TABS
500.0000 mg | ORAL_TABLET | Freq: Two times a day (BID) | ORAL | Status: AC
  Administered 2018-12-18 – 2018-12-19 (×4): 500 mg via ORAL
  Filled 2018-12-18 (×4): qty 2

## 2018-12-18 MED ORDER — SODIUM CHLORIDE 0.9 % IV SOLN
INTRAVENOUS | Status: DC | PRN
  Administered 2018-12-18: 500 mL via INTRAVENOUS
  Administered 2018-12-22: 250 mL via INTRAVENOUS

## 2018-12-18 MED ORDER — ADULT MULTIVITAMIN LIQUID CH
15.0000 mL | Freq: Every day | ORAL | Status: DC
  Administered 2018-12-18 – 2018-12-23 (×6): 15 mL via ORAL
  Filled 2018-12-18 (×6): qty 15

## 2018-12-18 NOTE — Progress Notes (Signed)
PT Cancellation Note  Patient Details Name: Joshua Beck MRN: [id number] DOB: 12-Jun-1966   Cancelled Treatment:    Reason Eval/Treat Not Completed: Patient not medically ready(BP 194/100 with SpO2 85%)   [name] 12/18/2018, 8:35 AM  Elwyn Reach, PT Acute Rehabilitation Services Pager: [phone] Office: [phone]

## 2018-12-18 NOTE — Progress Notes (Signed)
  NEUROSURGERY PROGRESS NOTE   No issues overnight.  Remains intubated  EXAM:  BP (!) 195/114   Pulse 100   Temp 99.2 F (37.3 C) (Axillary)   Resp (!) 21   Ht 6' (1.829 m)   Wt 67.5 kg   SpO2 96%   BMI 20.18 kg/m   Awakens to voice Left eye swollen shut, pupils reactive Purposeful movement LUE/LLE W/D RLE to pain. No spontaneous movement right side Crani/abdomen site: c/d/i  IMPRESSION/PLAN 53 y.o. male s/p LMCA stroke and POD#2 left hemicraniectomy. Neurologically stable. - Continue supportive care - No new NS recs. Will need staples removed in 10 days with outpt follow up after discharge.

## 2018-12-18 NOTE — Progress Notes (Signed)
NAME:  Joshua Beck, MRN:  [id number], DOB:  1966-10-13, LOS: 5 ADMISSION DATE:  12/11/2018, CONSULTATION DATE:  12/21/2018 REFERRING MD:  Glenda Chroman, CHIEF COMPLAINT:  CVA   Brief History   52 y/o male with large L MCA stroke    Past Medical History  Oropharyngeal cancer  Significant Hospital Events   1/22 Admi 1/25 decompressive craniectomy  Consults:  PCCM Neurosurgery  Procedures:  ETT 1/25 >  1/25 Decompressive craniectomy >   Significant Diagnostic Tests:  MRI brain 1/23 >> large acute ischemic CVA LT MCA territory, cytotoxic edema, 3 mm Lt to Rt shift, occlusive thrombus Lt MCA CT head 1/25 >> worsening cerebral edema with 14 mm shift, subfalcine herniation of Lt cingulate gyrus CT head 1/26 >> interval left frontoparietal craniectomy for decompression, L MCA changes consistent with infarct with swelling, decreased mass effect  Micro Data:  1/26 resp culture >  1/26 blood >   Antimicrobials:    Interim history/subjective:  Will reach for tube on WUA, but otherwise was not cooperative on WUA Mucus plugging this morning while on SBT, back on full support  Objective   Blood pressure (!) 153/94, pulse (!) 58, temperature 99.6 F (37.6 C), temperature source Axillary, resp. rate 16, height 6' (1.829 m), weight 67.5 kg, SpO2 90 %.    Vent Mode: PRVC FiO2 (%):  [40 %-50 %] 40 % Set Rate:  [14 bmp] 14 bmp Vt Set:  [620 mL] 620 mL PEEP:  [5 cmH20] 5 cmH20 Pressure Support:  [12 cmH20-14 cmH20] 12 cmH20 Plateau Pressure:  [11 cmH20-16 cmH20] 15 cmH20   Intake/Output Summary (Last 24 hours) at 12/18/2018 5009 Last data filed at 12/18/2018 0900 Gross per 24 hour  Intake 1534.3 ml  Output 1200 ml  Net 334.3 ml   Filed Weights   12/18/2018 2117 11/23/2018 0500  Weight: 69.6 kg 67.5 kg    Examination:  General:  In bed on vent HENT: some edema and bruising noted over left scalp, ETT in place PULM: Rhonchi B, vent supported breathing CV: RRR, no mgr GI:  BS+, soft, nontender MSK: normal bulk and tone Neuro: sedated on vent    Resolved Hospital Problem list     Assessment & Plan:  Acute respiratory failure with hypoxemia/Copious secretions > Full mechanical vent support > VAP prevention > Daily WUA/SBT > CXR now > bag lavage now, consider bronch  L MCA ischemic stroke with cerebral edema, c/p decompressive craniotomy > continue hypertonic saline > hold sedating meds as much as possible > secondary stroke prevention per neurology  Hypokalemia/hypophosphatemia > K phos  Hyperglycemia > SSI  Best practice:  Diet: tube feeding Pain/Anxiety/Delirium protocol (if indicated): yes RASS goal 0 to -1 with propofol VAP protocol (if indicated): yes DVT prophylaxis: SCD GI prophylaxis: Pantoprazole for stress ulcer prophylaxis Glucose control: SSI Mobility: bed rest Code Status: partial code, no CPR Family Communication: none bedside Disposition: remain in ICU  Labs   CBC: Recent Labs  Lab 12/19/2018 2043  12/14/18 1643 12/15/18 1600 12/15/2018 0500 11/25/2018 1724 12/17/18 0521 12/17/18 1033 12/18/18 0336  WBC 23.0*   < > 33.4* 31.2* 30.5*  --  19.6* 18.3* 22.9*  NEUTROABS 21.7*  --  30.4* 28.1*  --   --   --  15.7*  --   HGB 15.9   < > 14.2 12.3* 12.7* 11.9* 11.1* 10.3* 11.4*  HCT 49.3   < > 43.7 38.5* 40.2 35.0* 35.4* 33.9* 36.9*  MCV 100.6*   < >  100.2* 101.3* 101.3*  --  104.1* 104.3* 104.2*  PLT 348   < > 315 271 283  --  275 249 287   < > = values in this interval not displayed.    Basic Metabolic Panel: Recent Labs  Lab 12/15/18 0220  12/15/18 1332  11/24/2018 0500  11/24/2018 1702 11/22/2018 1724  12/17/18 0521 12/17/18 1033 12/17/18 1156 12/17/18 1502 12/17/18 2123 12/18/18 0336  NA 159*   < > 141   < > 144   < > 158* 147*   < > 154*  --  157* 160* 159* 157*  K 3.4*  --  3.7  --  3.0*  --   --  3.4*  --  3.4*  --   --   --   --  3.6  CL >130*  --  107  --  110  --   --   --   --  123*  --   --   --   --   126*  CO2 24  --  27  --  25  --   --   --   --  24  --   --   --   --  27  GLUCOSE 129*  --  133*  --  167*  --   --   --   --  192*  --   --   --   --  125*  BUN 10  --  13  --  12  --   --   --   --  19  --   --   --   --  23*  CREATININE 0.67  --  0.68  --  0.68  --   --   --   --  0.82  --   --   --   --  0.84  CALCIUM 7.1*  --  8.7*  --  8.4*  --   --   --   --  8.1*  --   --   --   --  8.2*  MG 1.8  --   --   --  2.3  --  1.9  --   --  2.5* 2.1  --  2.5*  --  2.4  PHOS  --   --  1.9*  --  1.4*  --  2.6  --   --  1.6* 1.9*  --  1.9*  --  2.4*   < > = values in this interval not displayed.   GFR: Estimated Creatinine Clearance: 98.2 mL/min (by C-G formula based on SCr of 0.84 mg/dL). Recent Labs  Lab 11/29/2018 0500 12/17/18 0521 12/17/18 1033 12/18/18 0336  WBC 30.5* 19.6* 18.3* 22.9*    Liver Function Tests: Recent Labs  Lab 12/18/2018 2043 12/14/18 0511 12/14/18 1643 12/15/18 1332 12/17/18 1156  AST 24 22 21 24  43*  ALT 20 19 16 16  49*  ALKPHOS 87 79 78 73 63  BILITOT 0.8 0.9 0.8 0.7 0.4  PROT 7.5 6.9 6.3* 6.3* 5.5*  ALBUMIN 4.1 3.6 3.4* 3.1* 2.3*   No results for input(s): LIPASE, AMYLASE in the last 168 hours. No results for input(s): AMMONIA in the last 168 hours.  ABG    Component Value Date/Time   PHART 7.389 11/30/2018 1724   PCO2ART 45.6 11/28/2018 1724   PO2ART 109.0 (H) 12/15/2018 1724   HCO3 27.7 12/08/2018 1724   TCO2 29 12/04/2018 1724   O2SAT  98.0 12/20/2018 1724     Coagulation Profile: Recent Labs  Lab 11/26/2018 2043  INR 0.95    Cardiac Enzymes: No results for input(s): CKTOTAL, CKMB, CKMBINDEX, TROPONINI in the last 168 hours.  HbA1C: Hgb A1c MFr Bld  Date/Time Value Ref Range Status  12/14/2018 12:04 AM 6.1 (H) 4.8 - 5.6 % Final    Comment:    (NOTE) Pre diabetes:          5.7%-6.4% Diabetes:              >6.4% Glycemic control for   <7.0% adults with diabetes     CBG: Recent Labs  Lab 12/17/18 1602 12/17/18 1944  12/17/18 2338 12/18/18 0316 12/18/18 0822  GLUCAP 94 108* 120* 131* 132*       Critical care time: 35 minutes     Roselie Awkward, MD [facility] Pager: [phone] Cell: [phone] If no response, call [phone]

## 2018-12-18 NOTE — Progress Notes (Signed)
STROKE TEAM PROGRESS NOTE   INTERVAL HISTORY No family at bedside. Pt still mildly drowsy, but more awake alert than yesterday, able to maintain eye opening. Left orbit edema improving. CT head yesterday showed improving MLS. Na 157.    Vitals:   12/18/18 1100 12/18/18 1138 12/18/18 1200 12/18/18 1300  BP: 138/86 (!) 148/89 (!) 147/88 (!) 152/93  Pulse: 66 71 73 66  Resp: 16 17 17 17   Temp:   99.3 F (37.4 C)   TempSrc:   Axillary   SpO2: 96% 97% 96% 97%  Weight:      Height:        CBC:  Recent Labs  Lab 12/15/18 1600  12/17/18 1033 12/18/18 0336  WBC 31.2*   < > 18.3* 22.9*  NEUTROABS 28.1*  --  15.7*  --   HGB 12.3*   < > 10.3* 11.4*  HCT 38.5*   < > 33.9* 36.9*  MCV 101.3*   < > 104.3* 104.2*  PLT 271   < > 249 287   < > = values in this interval not displayed.    Basic Metabolic Panel:  Recent Labs  Lab 12/17/18 0521  12/17/18 1502  12/18/18 0336 12/18/18 1114  NA 154*   < > 160*   < > 157* 157*  K 3.4*  --   --   --  3.6  --   CL 123*  --   --   --  126*  --   CO2 24  --   --   --  27  --   GLUCOSE 192*  --   --   --  125*  --   BUN 19  --   --   --  23*  --   CREATININE 0.82  --   --   --  0.84  --   CALCIUM 8.1*  --   --   --  8.2*  --   MG 2.5*   < > 2.5*  --  2.4  --   PHOS 1.6*   < > 1.9*  --  2.4*  --    < > = values in this interval not displayed.   Lipid Panel:     Component Value Date/Time   CHOL 212 (H) 12/14/2018 0004   TRIG 78 12/07/2018 1702   HDL 50 12/14/2018 0004   CHOLHDL 4.2 12/14/2018 0004   VLDL 12 12/14/2018 0004   LDLCALC 150 (H) 12/14/2018 0004   HgbA1c:  Lab Results  Component Value Date   HGBA1C 6.1 (H) 12/14/2018   Urine Drug Screen:     Component Value Date/Time   LABOPIA POSITIVE (A) 12/17/2018 1156   COCAINSCRNUR NONE DETECTED 12/17/2018 1156   LABBENZ NONE DETECTED 12/17/2018 1156   AMPHETMU NONE DETECTED 12/17/2018 1156   THCU NONE DETECTED 12/17/2018 1156   LABBARB NONE DETECTED 12/17/2018 1156     Alcohol Level No results found for: ETH  IMAGING  Ct Head Wo Contrast 12/03/2018 IMPRESSION:  1. Worsening cerebral edema with rightward midline shift now measuring 14 mm.  2. Subfalcine herniation of the left cingulate gyrus.  3. No acute hemorrhage.   Mr Brain Wo Contrast 12/14/2018 IMPRESSION:  1. Large confluent evolving acute ischemic left MCA territory infarct. Associated extensive cytotoxic edema with developing regional mass effect and new 3 mm left-to-right midline shift. No hydrocephalus or ventricular trapping. Associated scattered petechial hemorrhage within the area of infarction without frank hemorrhagic transformation.  2. Evidence for  occlusive thrombus throughout the left middle cerebral artery, stable from prior CTA. Abnormal flow void within the left ICA to the cavernous segment may reflect slow flow and/or occlusion.  3. Few additional subcentimeter ischemic infarcts within the right cerebral hemisphere as above, likely embolic.   Transthoracic Echocardiogram  12/14/2018 Study Conclusions - Left ventricle: The cavity size was normal. Systolic function was   normal. The estimated ejection fraction was in the range of 60%   to 65%. Wall motion was normal; there were no regional wall   motion abnormalities. Left ventricular diastolic function   parameters were normal. - Atrial septum: No defect or patent foramen ovale was identified. Impressions: - Normal study. No cardiac source of emboli was indentified.  Ct Head Wo Contrast  Result Date: 12/17/2018 CLINICAL DATA:  Left hemisphere stroke. Craniotomy for decompression. EXAM: CT HEAD WITHOUT CONTRAST TECHNIQUE: Contiguous axial images were obtained from the base of the skull through the vertex without intravenous contrast. COMPARISON:  12/18/2018 FINDINGS: Brain: Interval left frontoparietal craniectomy for decompression. Extreme low-density in the complete left MCA territory consistent with completed infarction with  swelling. No sign of hemorrhage. Subsequent to the craniectomy, there is less mass effect with left-to-right shift of 11.5 mm on this study compared with 15.5 mm on the previous scan. No ventricular trapping. No extra-axial collection. No new infarction. Vascular: No acute vascular finding. Hyperdense left MCA as seen previously. Skull: Left frontoparietal craniectomy as noted above. Sinuses/Orbits: Some inflammatory changes of the maxillary sinuses as noted previously. Orbits negative. Other: None IMPRESSION: Interval left frontoparietal craniectomy for decompression. Extreme low-density in the complete left MCA territory consistent with completed infarction with swelling. Decreased mass effect with left-to-right shift of 11.5 mm on this study compared with 15.5 mm on the previous scan. Electronically Signed   By: Nelson Chimes M.D.   On: 12/17/2018 17:41   Ct Head Wo Contrast  Result Date: 12/17/2018 CLINICAL DATA:  Left hemisphere stroke. Craniotomy for decompression. EXAM: CT HEAD WITHOUT CONTRAST TECHNIQUE: Contiguous axial images were obtained from the base of the skull through the vertex without intravenous contrast. COMPARISON:  12/02/2018 FINDINGS: Brain: Interval left frontoparietal craniectomy for decompression. Extreme low-density in the complete left MCA territory consistent with completed infarction with swelling. No sign of hemorrhage. Subsequent to the craniectomy, there is less mass effect with left-to-right shift of 11.5 mm on this study compared with 15.5 mm on the previous scan. No ventricular trapping. No extra-axial collection. No new infarction. Vascular: No acute vascular finding. Hyperdense left MCA as seen previously. Skull: Left frontoparietal craniectomy as noted above. Sinuses/Orbits: Some inflammatory changes of the maxillary sinuses as noted previously. Orbits negative. Other: None IMPRESSION: Interval left frontoparietal craniectomy for decompression. Extreme low-density in the  complete left MCA territory consistent with completed infarction with swelling. Decreased mass effect with left-to-right shift of 11.5 mm on this study compared with 15.5 mm on the previous scan. Electronically Signed   By: Nelson Chimes M.D.   On: 12/17/2018 17:41    PHYSICAL EXAM  Temp:  [96 F (35.6 C)-99.9 F (37.7 C)] 99.3 F (37.4 C) (01/27 1200) Pulse Rate:  [58-100] 66 (01/27 1300) Resp:  [14-24] 17 (01/27 1300) BP: (116-202)/(80-123) 152/93 (01/27 1300) SpO2:  [90 %-100 %] 97 % (01/27 1300) FiO2 (%):  [40 %-50 %] 40 % (01/27 1211)  General - Well nourished, well developed, mildly drowsy but more awake than yesterday.  Ophthalmologic - fundi not visualized due to noncooperation.  Cardiovascular - Regular rate  and rhythm.  Neuro - mildly drowsy, eyes open with voice and able to remain eyes open, but not following commands. Eyes in left gaze position, not cross midline even with doll's eye maneuver, not blinking to visual threat, not tracking, PERRL. Corneal reflex present on the left but not on the right, gag and cough present. Breathing over the vent.  Facial symmetry not able to test due to ET tube.  Tongue midline in mouth. On pain stimulation, withdraw in all extremities but L>R. DTR 1+ and babinski on the right. Sensation, coordination and gait not tested.   ASSESSMENT/PLAN Mr. Joshua Beck is a 52 y.o. 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 Not a candidate for TPA d/t presenting outside of TPA window. Not a candidate for IR given core of 194 ml.  Enrolled in the CHARM trial (IV glyburide versus placebo for cerebral edema).  Stroke: large left MCA infarct secondary with cytotoxic cerebral edema and petechial hemorrhage - embolic - source unknown, could be due to bilateral ICA high grade  stenosis/occlusion - enrolled in CHARM  CT head no acute hemorrhage; large area of acute ischemia in the left MCA territory with hyperdense left MCA. Aspects 3.  CTA head & neck Complete occlusion of the left middle cerebral artery with no collateralization. Diffuse narrowing of the left carotid system, with short segment occlusion of the distal left common carotid artery.70% stenosis of the right internal carotid artery at the bifurcation.   CT perfusion: Large core infarct of 194 mL in the left MCA territory.  Repeat CT head with evolving large left MCA territory infarct there which has now propagated to the left basal ganglia   MRI large evolving left MCA territory infarct with associated extensive cytotoxic edema with developing regional mass-effect. Few additional right cerebral hemisphere subcentimeter infarcts, likely embolic  2D Echo  - EF 60 - 65%. No cardiac source of emboli identified.   LDL 150  HgbA1c 6.1  UDS - positive for opiates (on MS Contin at home)  Heparin subq for VTE prophylaxis  aspirin 81 mg daily prior to admission, now on Aspirin 325 mg  Therapy recommendations:  pending  Disposition:  pending  Cerebral edema  Induced hypernatremia  CT repeat 12/05/2018 - significant MLS  NSG Dr. Kathyrn Sheriff consulted and s/p hemi-crani 12/03/2018  CT head repeat 12/17/18 - improved MLS, but still has shift at 11.5 mm  on 3% protocol for cerebral edema control  Sodium 156->158->154->157->160->157  On 3% at 75   Na goal 150-155  Check NA q6h  PICC line placement  Carotid stenosis  CTA head neck - Diffuse narrowing of the left carotid system, with short segment occlusion of the distal left common carotid artery.70% stenosis of the right internal carotid artery at the bifurcation.  On ASA  Respiratory failure  Due to decreased LOC  Intubated for airway protection and for surgery   CCM on board  Continue ventilation  Severe leukocytosis  WBC  33.4->31.2->30.5->19.6->18.3->22.9  No fever  Likely reactive  Close monitoring  Hypertension  Stable now . Permissive hypertension (OK if < 180/105) but gradually normalize in 5-7 days . Long-term BP goal normotensive  Hyperlipidemia  Home meds:  none  LDL 150, goal < 70  Add atorvastatin 40  Continue statin at discharge  Other Stroke Risk  Factors  Cigarette smoker advised to stop smoking  Hx stroke - 2011 without residue  Other Active Problems  Hypokalemia 3.4- replace->3.4 -> supplemented->3.6  Dysphagia - on TF @ 30   This patient is critically ill and at significant risk of neurological worsening, death and care requires constant monitoring of vital signs, hemodynamics,respiratory and cardiac monitoring, extensive review of multiple databases, frequent neurological assessment, discussion with family, other specialists and medical decision making of high complexity. I spent 40 minutes of neurocritical care time  in the care of  this patient.  Rosalin Hawking, MD PhD Stroke Neurology 12/18/2018 1:41 PM   To contact Stroke Continuity provider, please refer to [email or web address] After hours, contact General Neurology

## 2018-12-18 NOTE — Progress Notes (Signed)
Pt's O2 saturation dropped to 82% on PS/CPAP mode on ventilator; manually bagged for ~30 seconds until sp02 was above 90%. This happened 2 additional times over the next 5 minutes. RT was at bedside, switched patient back to full ventilator support. CCM aware.

## 2018-12-19 ENCOUNTER — Inpatient Hospital Stay (HOSPITAL_COMMUNITY): Payer: Medicare Other

## 2018-12-19 DIAGNOSIS — I63412 Cerebral infarction due to embolism of left middle cerebral artery: Secondary | ICD-10-CM

## 2018-12-19 LAB — CULTURE, RESPIRATORY W GRAM STAIN: Special Requests: NORMAL

## 2018-12-19 LAB — CULTURE, RESPIRATORY: CULTURE: NORMAL

## 2018-12-19 LAB — CBC
HCT: 38.1 % — ABNORMAL LOW (ref 39.0–52.0)
Hemoglobin: 11.8 g/dL — ABNORMAL LOW (ref 13.0–17.0)
MCH: 32 pg (ref 26.0–34.0)
MCHC: 31 g/dL (ref 30.0–36.0)
MCV: 103.3 fL — AB (ref 80.0–100.0)
PLATELETS: 285 10*3/uL (ref 150–400)
RBC: 3.69 MIL/uL — ABNORMAL LOW (ref 4.22–5.81)
RDW: 13.4 % (ref 11.5–15.5)
WBC: 22.2 10*3/uL — ABNORMAL HIGH (ref 4.0–10.5)
nRBC: 0.2 % (ref 0.0–0.2)

## 2018-12-19 LAB — GLUCOSE, CAPILLARY
Glucose-Capillary: 130 mg/dL — ABNORMAL HIGH (ref 70–99)
Glucose-Capillary: 132 mg/dL — ABNORMAL HIGH (ref 70–99)
Glucose-Capillary: 152 mg/dL — ABNORMAL HIGH (ref 70–99)
Glucose-Capillary: 164 mg/dL — ABNORMAL HIGH (ref 70–99)
Glucose-Capillary: 169 mg/dL — ABNORMAL HIGH (ref 70–99)
Glucose-Capillary: 182 mg/dL — ABNORMAL HIGH (ref 70–99)

## 2018-12-19 LAB — MAGNESIUM: Magnesium: 2.4 mg/dL (ref 1.7–2.4)

## 2018-12-19 LAB — BASIC METABOLIC PANEL
Anion gap: 8 (ref 5–15)
BUN: 21 mg/dL — ABNORMAL HIGH (ref 6–20)
CALCIUM: 8.4 mg/dL — AB (ref 8.9–10.3)
CO2: 28 mmol/L (ref 22–32)
Chloride: 117 mmol/L — ABNORMAL HIGH (ref 98–111)
Creatinine, Ser: 0.77 mg/dL (ref 0.61–1.24)
GFR calc Af Amer: >60 mL/min (ref >60)
GFR calc non Af Amer: >60 mL/min (ref >60)
Glucose, Bld: 144 mg/dL — ABNORMAL HIGH (ref 70–99)
Potassium: 3.5 mmol/L (ref 3.5–5.1)
Sodium: 153 mmol/L — ABNORMAL HIGH (ref 135–145)

## 2018-12-19 LAB — SODIUM
Sodium: 154 mmol/L — ABNORMAL HIGH (ref 135–145)
Sodium: 161 mmol/L (ref 135–145)
Sodium: 151 mmol/L — ABNORMAL HIGH (ref 135–145)
Sodium: 151 mmol/L — ABNORMAL HIGH (ref 135–145)

## 2018-12-19 LAB — TRIGLYCERIDES: Triglycerides: 170 mg/dL — ABNORMAL HIGH (ref <150)

## 2018-12-19 LAB — PHOSPHORUS: Phosphorus: 3.5 mg/dL (ref 2.5–4.6)

## 2018-12-19 MED ORDER — OSMOLITE 1.2 CAL PO LIQD
1000.0000 mL | ORAL | Status: DC
  Administered 2018-12-19 – 2018-12-20 (×2): 1000 mL
  Filled 2018-12-19 (×2): qty 1000

## 2018-12-19 MED ORDER — LISINOPRIL 20 MG PO TABS
20.0000 mg | ORAL_TABLET | Freq: Two times a day (BID) | ORAL | Status: DC
  Administered 2018-12-19 – 2018-12-23 (×9): 20 mg via ORAL
  Filled 2018-12-19 (×9): qty 1

## 2018-12-19 NOTE — Progress Notes (Signed)
Nutrition Follow-up  DOCUMENTATION CODES:   Not applicable  INTERVENTION:   Increase Osmolite 1.2 to 65 ml/hr via Cortrak tube Continue 30 ml Prostat BID MVI daily  Provides: 2072 kcal, 116 grams protein, and 1265 ml free water.    NUTRITION DIAGNOSIS:   Inadequate oral intake related to inability to eat as evidenced by NPO status. Ongoing.   GOAL:   Patient will meet greater than or equal to 90% of their needs Met.   MONITOR:   TF tolerance, I & O's  ASSESSMENT:   Pt with PMH of oropharyngeal cancer s/p chemo, XRT, HTN, tobacco abuse, who was admitted with large L MCA ischemic CVA.    Pt in the ICU but remains off the vent for now.  Noted low phosphorus and potassium which are being repleated.   1/24 Cortrak placed, TF started 1/25 pt intubated for craniectomy  1/26 TF decreased to 30 ml/hr   Patient is currently intubated on ventilator support MV: 13.5 L/min Temp (24hrs), Avg:98.9 F (37.2 C), Min:98.2 F (36.8 C), Max:99.4 F (37.4 C)  Medications reviewed: 2-6 units novolog every 4 hours, MVI, Kphos 500 mg BID, 3% hypertonic saline Labs reviewed: NA 153 (H) on 3% BP: 180/97 MAP: 109   I/O: +1462 ml since admit UOP: 500 ml x 24 hrs     Diet Order:   Diet Order            Diet NPO time specified  Diet effective now              EDUCATION NEEDS:   No education needs have been identified at this time  Skin:  Skin Assessment: Reviewed RN Assessment  Last BM:  unknown  Height:   Ht Readings from Last 1 Encounters:  11/28/2018 6' (1.829 m)    Weight:   Wt Readings from Last 1 Encounters:  12/03/2018 67.5 kg    Ideal Body Weight:  80.9 kg  BMI:  Body mass index is 20.18 kg/m.  Estimated Nutritional Needs:   Kcal:  1956  Protein:  100-115 grams  Fluid:  > 2 L/day  Maylon Peppers RD, LDN, CNSC [phone] Pager [phone] After Hours Pager

## 2018-12-19 NOTE — Progress Notes (Signed)
STROKE TEAM PROGRESS NOTE   INTERVAL HISTORY No family at bedside. Pt not open eyes with sedation off, able to maintain eye opening. Left orbit edema improving. CT head yesterday showed improving MLS. Na 157.    Vitals:   12/19/18 0500 12/19/18 0600 12/19/18 0700 12/19/18 0837  BP: (!) 145/86 (!) 162/101 (!) 150/86 (!) 180/97  Pulse: (!) 53 62 65 75  Resp: 16 18 17 20   Temp:    99.4 F (37.4 C)  TempSrc:    Axillary  SpO2: 98% 97% 97% 95%  Weight:      Height:        CBC:  Recent Labs  Lab 12/15/18 1600  12/17/18 1033 12/18/18 0336 12/19/18 0359  WBC 31.2*   < > 18.3* 22.9* 22.2*  NEUTROABS 28.1*  --  15.7*  --   --   HGB 12.3*   < > 10.3* 11.4* 11.8*  HCT 38.5*   < > 33.9* 36.9* 38.1*  MCV 101.3*   < > 104.3* 104.2* 103.3*  PLT 271   < > 249 287 285   < > = values in this interval not displayed.    Basic Metabolic Panel:  Recent Labs  Lab 12/18/18 0336  12/19/18 0116 12/19/18 0359  NA 157*   < > 154* 153*  K 3.6  --   --  3.5  CL 126*  --   --  117*  CO2 27  --   --  28  GLUCOSE 125*  --   --  144*  BUN 23*  --   --  21*  CREATININE 0.84  --   --  0.77  CALCIUM 8.2*  --   --  8.4*  MG 2.4  --   --  2.4  PHOS 2.4*  --   --  3.5   < > = values in this interval not displayed.   Lipid Panel:     Component Value Date/Time   CHOL 212 (H) 12/14/2018 0004   TRIG 78 12/19/2018 1702   HDL 50 12/14/2018 0004   CHOLHDL 4.2 12/14/2018 0004   VLDL 12 12/14/2018 0004   LDLCALC 150 (H) 12/14/2018 0004   HgbA1c:  Lab Results  Component Value Date   HGBA1C 6.1 (H) 12/14/2018   Urine Drug Screen:     Component Value Date/Time   LABOPIA POSITIVE (A) 12/17/2018 1156   COCAINSCRNUR NONE DETECTED 12/17/2018 1156   LABBENZ NONE DETECTED 12/17/2018 1156   AMPHETMU NONE DETECTED 12/17/2018 1156   THCU NONE DETECTED 12/17/2018 1156   LABBARB NONE DETECTED 12/17/2018 1156    Alcohol Level No results found for: ETH  IMAGING  Ct Head Wo  Contrast 11/26/2018 IMPRESSION:  1. Worsening cerebral edema with rightward midline shift now measuring 14 mm.  2. Subfalcine herniation of the left cingulate gyrus.  3. No acute hemorrhage.   Mr Brain Wo Contrast 12/14/2018 IMPRESSION:  1. Large confluent evolving acute ischemic left MCA territory infarct. Associated extensive cytotoxic edema with developing regional mass effect and new 3 mm left-to-right midline shift. No hydrocephalus or ventricular trapping. Associated scattered petechial hemorrhage within the area of infarction without frank hemorrhagic transformation.  2. Evidence for occlusive thrombus throughout the left middle cerebral artery, stable from prior CTA. Abnormal flow void within the left ICA to the cavernous segment may reflect slow flow and/or occlusion.  3. Few additional subcentimeter ischemic infarcts within the right cerebral hemisphere as above, likely embolic.   Transthoracic Echocardiogram  12/14/2018  Study Conclusions - Left ventricle: The cavity size was normal. Systolic function was   normal. The estimated ejection fraction was in the range of 60%   to 65%. Wall motion was normal; there were no regional wall   motion abnormalities. Left ventricular diastolic function   parameters were normal. - Atrial septum: No defect or patent foramen ovale was identified. Impressions: - Normal study. No cardiac source of emboli was indentified.  Ct Head Wo Contrast  Result Date: 12/17/2018 CLINICAL DATA:  Left hemisphere stroke. Craniotomy for decompression. EXAM: CT HEAD WITHOUT CONTRAST TECHNIQUE: Contiguous axial images were obtained from the base of the skull through the vertex without intravenous contrast. COMPARISON:  12/01/2018 FINDINGS: Brain: Interval left frontoparietal craniectomy for decompression. Extreme low-density in the complete left MCA territory consistent with completed infarction with swelling. No sign of hemorrhage. Subsequent to the craniectomy,  there is less mass effect with left-to-right shift of 11.5 mm on this study compared with 15.5 mm on the previous scan. No ventricular trapping. No extra-axial collection. No new infarction. Vascular: No acute vascular finding. Hyperdense left MCA as seen previously. Skull: Left frontoparietal craniectomy as noted above. Sinuses/Orbits: Some inflammatory changes of the maxillary sinuses as noted previously. Orbits negative. Other: None IMPRESSION: Interval left frontoparietal craniectomy for decompression. Extreme low-density in the complete left MCA territory consistent with completed infarction with swelling. Decreased mass effect with left-to-right shift of 11.5 mm on this study compared with 15.5 mm on the previous scan. Electronically Signed   By: Nelson Chimes M.D.   On: 12/17/2018 17:41   Ct Head Wo Contrast  Result Date: 12/17/2018 CLINICAL DATA:  Left hemisphere stroke. Craniotomy for decompression. EXAM: CT HEAD WITHOUT CONTRAST TECHNIQUE: Contiguous axial images were obtained from the base of the skull through the vertex without intravenous contrast. COMPARISON:  11/29/2018 FINDINGS: Brain: Interval left frontoparietal craniectomy for decompression. Extreme low-density in the complete left MCA territory consistent with completed infarction with swelling. No sign of hemorrhage. Subsequent to the craniectomy, there is less mass effect with left-to-right shift of 11.5 mm on this study compared with 15.5 mm on the previous scan. No ventricular trapping. No extra-axial collection. No new infarction. Vascular: No acute vascular finding. Hyperdense left MCA as seen previously. Skull: Left frontoparietal craniectomy as noted above. Sinuses/Orbits: Some inflammatory changes of the maxillary sinuses as noted previously. Orbits negative. Other: None IMPRESSION: Interval left frontoparietal craniectomy for decompression. Extreme low-density in the complete left MCA territory consistent with completed infarction  with swelling. Decreased mass effect with left-to-right shift of 11.5 mm on this study compared with 15.5 mm on the previous scan. Electronically Signed   By: Nelson Chimes M.D.   On: 12/17/2018 17:41    PHYSICAL EXAM  Temp:  [98.2 F (36.8 C)-99.4 F (37.4 C)] 99.4 F (37.4 C) (01/28 0837) Pulse Rate:  [53-93] 75 (01/28 0837) Resp:  [15-24] 20 (01/28 0837) BP: (138-193)/(86-111) 180/97 (01/28 0837) SpO2:  [95 %-99 %] 95 % (01/28 0837) FiO2 (%):  [40 %] 40 % (01/28 0840)  General - Well nourished, well developed, intubated off sedation.  Ophthalmologic - fundi not visualized due to noncooperation.  Cardiovascular - Regular rate and rhythm.  Neuro - intubated off sedation, eyes closed, not open with voice or pain, not following commands. Eyes in mid position, doll's eye present, not blinking to visual threat, not tracking, PERRL. Corneal reflex present on the left but not on the right, gag and cough present. Breathing over the vent.  Facial symmetry  not able to test due to ET tube.  Tongue midline in mouth. On pain stimulation, withdraw in left upper and lower extremities against gravity, slight residual in right upper extremity but 2/5 left lower extremity on pain stimulation. DTR 1+ and babinski positive on the right. Sensation, coordination and gait not tested.   ASSESSMENT/PLAN Mr. KYRIN GRATZ is a 53 y.o. 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 Not a candidate for TPA d/t presenting outside of TPA window. Not a candidate for IR given core of 194 ml.  Enrolled in the CHARM trial (IV glyburide versus placebo for cerebral edema).  Stroke: large left MCA infarct secondary with cytotoxic cerebral edema and petechial hemorrhage - embolic - source unknown, could be due to bilateral ICA high grade  stenosis/occlusion - enrolled in CHARM  CT head no acute hemorrhage; large area of acute ischemia in the left MCA territory with hyperdense left MCA. Aspects 3.  CTA head & neck Complete occlusion of the left middle cerebral artery with no collateralization. Diffuse narrowing of the left carotid system, with short segment occlusion of the distal left common carotid artery.70% stenosis of the right internal carotid artery at the bifurcation.   CT perfusion: Large core infarct of 194 mL in the left MCA territory.  Repeat CT head with evolving large left MCA territory infarct there which has now propagated to the left basal ganglia   MRI large evolving left MCA territory infarct with associated extensive cytotoxic edema with developing regional mass-effect. Few additional right cerebral hemisphere subcentimeter infarcts, likely embolic  2D Echo  - EF 60 - 65%. No cardiac source of emboli identified.   LDL 150  HgbA1c 6.1  UDS - positive for opiates (on MS Contin at home)  Heparin subq for VTE prophylaxis  aspirin 81 mg daily prior to admission, now on Aspirin 325 mg  Therapy recommendations:  pending  Disposition:  pending  Cerebral edema  Induced hypernatremia  CT repeat 12/08/2018 - significant MLS  NSG Dr. Kathyrn Sheriff consulted and s/p hemi-crani 12/15/2018  CT head repeat 12/17/18 - improved MLS, but still has shift at 11.5 mm  CT head repeat today pending  on 3% protocol for cerebral edema control  Sodium 156->158->154->157->160->157->153  Resume 3% at 50  Na goal 150-160  Check NA q6h  PICC line placed  Carotid stenosis  CTA head neck - Diffuse narrowing of the left carotid system, with short segment occlusion of the distal left common carotid artery.70% stenosis of the right internal carotid artery at the bifurcation.  On ASA  Respiratory failure  Due to decreased LOC  Intubated for airway protection and for surgery   CCM on board  Continue  ventilation  Severe leukocytosis  WBC 33.4->31.2->30.5->19.6->18.3->22.9-> 22.2  No fever  Likely reactive  Close monitoring  Hypertension  Stable now  On lisinopril 20mg  bid . BP goal < 180  Hyperlipidemia  Home meds:  none  LDL 150, goal < 70  Add atorvastatin 40  Continue statin at discharge  Other Stroke Risk Factors  Cigarette smoker advised to stop smoking  Hx stroke - 2011 without residue  Other Active Problems  Hypokalemia 3.4- replace->3.4 -> supplemented->3.6-3.5  Dysphagia - on TF @ 30   This patient is critically ill and at significant risk of neurological worsening, death and  care requires constant monitoring of vital signs, hemodynamics,respiratory and cardiac monitoring, extensive review of multiple databases, frequent neurological assessment, discussion with family, other specialists and medical decision making of high complexity. I spent 35 minutes of neurocritical care time  in the care of  this patient.  Rosalin Hawking, MD PhD Stroke Neurology 12/19/2018 10:40 AM   To contact Stroke Continuity provider, please refer to [email or web address] After hours, contact General Neurology

## 2018-12-19 NOTE — Progress Notes (Signed)
Pt transported from 4N27 to CT and back without incident.

## 2018-12-19 NOTE — Evaluation (Signed)
Physical Therapy Evaluation Patient Details Name: Joshua Beck MRN: [id number] DOB: 12-17-1965 Today's Date: 12/19/2018   History of Present Illness  Pt is a 53 y.o. 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 Pt not a candidate for TPA d/t presenting outside of TPA window. Not a candidate for IR. MRI revealed L MCA acute ischemic infarct with left to right midline shift. s/p craniectomy 1/25  Clinical Impression  Pt lethargic on vent with limited sedation. Pt with automatic movement of LUE throughout session reaching toward face but no response to commands or cues. Pt with response to noxious stimuli inconsistently on RLE and increased tone with movement of RUE. Pt with limited eye opening throughout session, no visual tracking with blink to threat 50% of the time. Pt with decreased strength, function, mobility and cognition who will benefit from acute therapy to maximize balance and mobility to decrease burden of care.   Pt on PRVC 40% FiO2 with RR 22, SpO2 99% HR 81 BP 151/90    Follow Up Recommendations LTACH;Supervision/Assistance - 24 hour    Equipment Recommendations  Hospital bed    Recommendations for Other Services       Precautions / Restrictions Precautions Precaution Comments: vent, cortrak      Mobility  Bed Mobility Overal bed mobility: Needs Assistance Bed Mobility: Rolling;Supine to Sit;Sit to Supine Rolling: Total assist;+2 for safety/equipment   Supine to sit: Total assist;+2 for safety/equipment Sit to supine: Total assist;+2 for safety/equipment   General bed mobility comments: total assist to roll bil with assist to position extremities and head, no resistance or assistance with mobility. Foot egress utilized for sitting in full chair position with left lateral neck flexion and  left lean  Transfers                 General transfer comment: unable  Ambulation/Gait             General Gait Details: unable  Stairs            Wheelchair Mobility    Modified Rankin (Stroke Patients Only) Modified Rankin (Stroke Patients Only) Pre-Morbid Rankin Score: No symptoms Modified Rankin: Severe disability     Balance Overall balance assessment: Needs assistance   Sitting balance-Leahy Scale: Zero                                       Pertinent Vitals/Pain      Home Living Family/patient expects to be discharged to:: Private residence Living Arrangements: Children               Additional Comments: 4 kids, 3 local    Prior Function Level of Independence: Independent         Comments: works part time at [employer], avid Biomedical scientist        Extremity/Trunk Assessment   Upper Extremity Assessment Upper Extremity Assessment: LUE deficits/detail;RUE deficits/detail RUE Deficits / Details: pt with extensor tone at time with movement and positioning of arm LUE Deficits / Details: pt with automatic movement touching head and reaching for catheter, no movement to command, grossly functional     Lower Extremity Assessment Lower Extremity Assessment: RLE deficits/detail;LLE deficits/detail RLE Deficits / Details:  withdrawl to pain with muscle belly pressure at calf, no response to nail bed pressure, grossly functional PROM LLE Deficits / Details: pt with automatic knee flexion, no response to command, PROM wFL       Communication   Communication: Other (comment)(vent, ETT)  Cognition Arousal/Alertness: Lethargic   Overall Cognitive Status: Difficult to assess                                        General Comments      Exercises General Exercises - Lower Extremity Long Arc Quad: PROM;Both;Seated;10 reps Hip Flexion/Marching: PROM;Both;10 reps;Seated    Assessment/Plan    PT Assessment Patient needs continued PT services  PT Problem List Decreased strength;Decreased balance;Decreased cognition;Impaired tone;Decreased range of motion;Decreased mobility;Decreased knowledge of use of DME;Cardiopulmonary status limiting activity;Decreased activity tolerance;Decreased coordination;Decreased safety awareness;Impaired sensation       PT Treatment Interventions Patient/family education;Therapeutic activities;Neuromuscular re-education;Therapeutic exercise;Cognitive remediation;Functional mobility training;Balance training    PT Goals (Current goals can be found in the Care Plan section)  Acute Rehab PT Goals PT Goal Formulation: Patient unable to participate in goal setting Time For Goal Achievement: 01/02/19 Potential to Achieve Goals: Fair    Frequency Min 2X/week   Barriers to discharge Decreased caregiver support 3 kids are family support and they work    Co-evaluation               AM-PAC PT "6 Clicks" Mobility  Outcome Measure Help needed turning from your back to your side while in a flat bed without using bedrails?: Total Help needed moving from lying on your back to sitting on the side of a flat bed without using bedrails?: Total Help needed moving to and from a bed to a chair (including a wheelchair)?: Total Help needed standing up from a chair using your arms (e.g., wheelchair or bedside chair)?: Total Help needed to walk in hospital room?: Total Help needed climbing 3-5 steps with a railing? : Total 6 Click Score: 6    End of Session   Activity Tolerance: Patient tolerated treatment well Patient left: in bed;with call bell/phone within reach;with bed alarm set Nurse Communication: Mobility status;Need for lift equipment PT Visit Diagnosis: Other abnormalities of gait and mobility (R26.89);Muscle weakness (generalized) (M62.81);Other symptoms and signs involving the nervous system (R29.898);Hemiplegia and  hemiparesis Hemiplegia - Right/Left: Right Hemiplegia - dominant/non-dominant: Dominant Hemiplegia - caused by: Cerebral infarction    Time: 0805-0830 PT Time Calculation (min) (ACUTE ONLY): 25 min   Charges:   PT Evaluation $PT Eval High Complexity: 1 High          [place], PT Acute Rehabilitation Services Pager: [phone] Office: [phone]   Marcellene Shivley B Danesha Kirchoff 12/19/2018, 12:15 PM

## 2018-12-19 NOTE — Progress Notes (Signed)
NAME:  Joshua Beck, MRN:  [id number], DOB:  1966/03/20, LOS: 6 ADMISSION DATE:  12/08/2018, CONSULTATION DATE:  12/20/2018 REFERRING MD:  Glenda Chroman, CHIEF COMPLAINT:  CVA   Brief History   53 y/o male with large L MCA stroke    Past Medical History  Oropharyngeal cancer  Significant Hospital Events   1/22 Admi 1/25 decompressive craniectomy  Consults:  PCCM Neurosurgery  Procedures:  ETT 1/25 >  1/25 Decompressive craniectomy >  PICC 1/23 >   Significant Diagnostic Tests:  MRI brain 1/23 >> large acute ischemic CVA LT MCA territory, cytotoxic edema, 3 mm Lt to Rt shift, occlusive thrombus Lt MCA CT head 1/25 >> worsening cerebral edema with 14 mm shift, subfalcine herniation of Lt cingulate gyrus CT head 1/26 >> interval left frontoparietal craniectomy for decompression, L MCA changes consistent with infarct with swelling, decreased mass effect  Micro Data:  1/26 resp culture >  1/26 blood >   Antimicrobials:    Interim history/subjective:  Had thick mucus plug removed yesterday, then weaned 7 hours on 8/5 PSV Still drowsy  Objective   Blood pressure (!) 150/86, pulse 65, temperature 99.2 F (37.3 C), temperature source Axillary, resp. rate 17, height 6' (1.829 m), weight 67.5 kg, SpO2 97 %.    Vent Mode: PRVC FiO2 (%):  [40 %] 40 % Set Rate:  [14 bmp] 14 bmp Vt Set:  [620 mL] 620 mL PEEP:  [5 cmH20] 5 cmH20 Pressure Support:  [8 cmH20] [place] Pressure:  [17 cmH20-19 cmH20] 17 cmH20   Intake/Output Summary (Last 24 hours) at 12/19/2018 7253 Last data filed at 12/19/2018 0500 Gross per 24 hour  Intake 717.09 ml  Output 500 ml  Net 217.09 ml   Filed Weights   11/25/2018 2117 11/28/2018 0500  Weight: 69.6 kg 67.5 kg    Examination:  General:  In bed on vent HENT: Surgical scar without redness or drainage over scalp ETT in place PULM: CTA B, vent supported breathing CV: RRR, no mgr GI: BS+, soft, nontender MSK: normal bulk and tone Neuro:  drowsy, will stir to pain, not to voice     Resolved Hospital Problem list     Assessment & Plan:  Acute respiratory failure with hypoxemia: improved 1/28, mental status barrier to extubation > Full mechanical vent support > VAP prevention > Daily WUA/SBT, hopeful for extubation if mental status allows  L MCA ischemic stroke with cerebral edema, c/p decompressive craniotomy > continue hypertonic saline > hold sedating meds > secondary stroke prevention per neurology  Persistent acute encephalopathy 1/28: presumably related to stroke > consider repeat head imaging, defer to Neurology  Hypokalemia/hypophosphatemia > improved > continue scheduled KPhos  Hyperglycemia > Continue SSI  Best practice:  Diet: tube feeding Pain/Anxiety/Delirium protocol (if indicated):RASS goal 0 VAP protocol (if indicated): yes DVT prophylaxis: SCD GI prophylaxis: Pantoprazole for stress ulcer prophylaxis Glucose control: SSI Mobility: bed rest Code Status: partial code, no CPR Family Communication: none bedside Disposition: remain in ICU  Labs   CBC: Recent Labs  Lab 12/22/2018 2043  12/14/18 1643 12/15/18 1600 11/29/2018 0500 12/11/2018 1724 12/17/18 0521 12/17/18 1033 12/18/18 0336 12/19/18 0359  WBC 23.0*   < > 33.4* 31.2* 30.5*  --  19.6* 18.3* 22.9* 22.2*  NEUTROABS 21.7*  --  30.4* 28.1*  --   --   --  15.7*  --   --   HGB 15.9   < > 14.2 12.3* 12.7* 11.9* 11.1* 10.3* 11.4* 11.8*  HCT 49.3   < > 43.7 38.5* 40.2 35.0* 35.4* 33.9* 36.9* 38.1*  MCV 100.6*   < > 100.2* 101.3* 101.3*  --  104.1* 104.3* 104.2* 103.3*  PLT 348   < > 315 271 283  --  275 249 287 285   < > = values in this interval not displayed.    Basic Metabolic Panel: Recent Labs  Lab 12/15/18 1332  12/04/2018 0500  11/27/2018 1724  12/17/18 0521 12/17/18 1033  12/17/18 1502  12/18/18 0336 12/18/18 1114 12/18/18 1644 12/19/18 0116 12/19/18 0359  NA 141   < > 144   < > 147*   < > 154*  --    < > 160*   < >  157* 157* 157* 154* 153*  K 3.7  --  3.0*  --  3.4*  --  3.4*  --   --   --   --  3.6  --   --   --  3.5  CL 107  --  110  --   --   --  123*  --   --   --   --  126*  --   --   --  117*  CO2 27  --  25  --   --   --  24  --   --   --   --  27  --   --   --  28  GLUCOSE 133*  --  167*  --   --   --  192*  --   --   --   --  125*  --   --   --  144*  BUN 13  --  12  --   --   --  19  --   --   --   --  23*  --   --   --  21*  CREATININE 0.68  --  0.68  --   --   --  0.82  --   --   --   --  0.84  --   --   --  0.77  CALCIUM 8.7*  --  8.4*  --   --   --  8.1*  --   --   --   --  8.2*  --   --   --  8.4*  MG  --   --  2.3   < >  --   --  2.5* 2.1  --  2.5*  --  2.4  --   --   --  2.4  PHOS 1.9*  --  1.4*   < >  --   --  1.6* 1.9*  --  1.9*  --  2.4*  --   --   --  3.5   < > = values in this interval not displayed.   GFR: Estimated Creatinine Clearance: 103.1 mL/min (by C-G formula based on SCr of 0.77 mg/dL). Recent Labs  Lab 12/17/18 0521 12/17/18 1033 12/18/18 0336 12/19/18 0359  WBC 19.6* 18.3* 22.9* 22.2*    Liver Function Tests: Recent Labs  Lab 11/28/2018 2043 12/14/18 0511 12/14/18 1643 12/15/18 1332 12/17/18 1156  AST 24 22 21 24  43*  ALT 20 19 16 16  49*  ALKPHOS 87 79 78 73 63  BILITOT 0.8 0.9 0.8 0.7 0.4  PROT 7.5 6.9 6.3* 6.3* 5.5*  ALBUMIN 4.1 3.6 3.4* 3.1* 2.3*   No results for input(s):  LIPASE, AMYLASE in the last 168 hours. No results for input(s): AMMONIA in the last 168 hours.  ABG    Component Value Date/Time   PHART 7.389 11/24/2018 1724   PCO2ART 45.6 12/04/2018 1724   PO2ART 109.0 (H) 12/19/2018 1724   HCO3 27.7 12/10/2018 1724   TCO2 29 12/20/2018 1724   O2SAT 98.0 12/12/2018 1724     Coagulation Profile: Recent Labs  Lab 12/15/2018 2043  INR 0.95    Cardiac Enzymes: No results for input(s): CKTOTAL, CKMB, CKMBINDEX, TROPONINI in the last 168 hours.  HbA1C: Hgb A1c MFr Bld  Date/Time Value Ref Range Status  12/14/2018 12:04 AM 6.1 (H)  4.8 - 5.6 % Final    Comment:    (NOTE) Pre diabetes:          5.7%-6.4% Diabetes:              >6.4% Glycemic control for   <7.0% adults with diabetes     CBG: Recent Labs  Lab 12/18/18 1247 12/18/18 1604 12/18/18 2016 12/18/18 2351 12/19/18 0359  GLUCAP 130* 106* 134* 120* 130*       Critical care time: 31 minutes     Roselie Awkward, MD [facility] Pager: [phone] Cell: [phone] If no response, call [phone]

## 2018-12-19 NOTE — Progress Notes (Signed)
Sent down pt's Na+ lab at 1150. Lab notified me at 1300 that they had not received it yet. Na+ was redrawn and sent down at 1355. Will await results at this time.

## 2018-12-19 NOTE — Progress Notes (Signed)
  NEUROSURGERY PROGRESS NOTE   No issues overnight.  Remains intubated. Not on sedation.  EXAM:  BP (!) 165/87   Pulse 88   Temp 99.1 F (37.3 C) (Axillary)   Resp (!) 24   Ht 6' (1.829 m)   Wt 67.5 kg   SpO2 98%   BMI 20.18 kg/m   Intubated perrl spontaneous movement LUE. W/D LUE/LLE to painful stimuli No RUE movement observed today Crani/abd site: c/d/i  IMPRESSION/PLAN 53 y.o. male s/p LMCA stroke and POD#3 left hemicraniectomy. Neurologically without significant changed. - Continue supportive care - No new NS recs. Will sign off.Will need staples removed in 10 days with outpt follow up after discharge. Please call for any concerns.

## 2018-12-19 NOTE — Progress Notes (Signed)
CRITICAL VALUE ALERT  Critical Value:  Na+ 161  Date & Time Notied:  12/19/2018  Provider Notified: Dr Erlinda Hong  Orders Received/Actions taken: Continue 3% gtt. Get phelebotomy to draw next Na+ level to see if that one is still high.

## 2018-12-20 DIAGNOSIS — J398 Other specified diseases of upper respiratory tract: Secondary | ICD-10-CM

## 2018-12-20 LAB — GLUCOSE, CAPILLARY
Glucose-Capillary: 150 mg/dL — ABNORMAL HIGH (ref 70–99)
Glucose-Capillary: 150 mg/dL — ABNORMAL HIGH (ref 70–99)
Glucose-Capillary: 102 mg/dL — ABNORMAL HIGH (ref 70–99)
Glucose-Capillary: 140 mg/dL — ABNORMAL HIGH (ref 70–99)
Glucose-Capillary: 141 mg/dL — ABNORMAL HIGH (ref 70–99)

## 2018-12-20 LAB — CBC WITH DIFFERENTIAL/PLATELET
Abs Immature Granulocytes: 1.06 10*3/uL — ABNORMAL HIGH (ref 0.00–0.07)
Basophils Absolute: 0.1 10*3/uL (ref 0.0–0.1)
Basophils Relative: 0 %
Eosinophils Absolute: 0.1 10*3/uL (ref 0.0–0.5)
Eosinophils Relative: 0 %
HCT: 39.5 % (ref 39.0–52.0)
Hemoglobin: 12.4 g/dL — ABNORMAL LOW (ref 13.0–17.0)
Immature Granulocytes: 4 %
Lymphocytes Relative: 8 %
Lymphs Abs: 2.1 10*3/uL (ref 0.7–4.0)
MCH: 32.1 pg (ref 26.0–34.0)
MCHC: 31.4 g/dL (ref 30.0–36.0)
MCV: 102.3 fL — AB (ref 80.0–100.0)
MONOS PCT: 6 %
Monocytes Absolute: 1.6 10*3/uL — ABNORMAL HIGH (ref 0.1–1.0)
Neutro Abs: 20.9 10*3/uL — ABNORMAL HIGH (ref 1.7–7.7)
Neutrophils Relative %: 82 %
Platelets: 319 10*3/uL (ref 150–400)
RBC: 3.86 MIL/uL — ABNORMAL LOW (ref 4.22–5.81)
RDW: 13.2 % (ref 11.5–15.5)
WBC: 25.8 10*3/uL — ABNORMAL HIGH (ref 4.0–10.5)
nRBC: 0.3 % — ABNORMAL HIGH (ref 0.0–0.2)

## 2018-12-20 LAB — RETICULOCYTES
Immature Retic Fract: 28.8 % — ABNORMAL HIGH (ref 2.3–15.9)
RBC.: 3.86 MIL/uL — AB (ref 4.22–5.81)
RETIC CT PCT: 1.8 % (ref 0.4–3.1)
Retic Count, Absolute: 70.3 10*3/uL (ref 19.0–186.0)

## 2018-12-20 LAB — HEPATIC FUNCTION PANEL
ALBUMIN: 2.2 g/dL — AB (ref 3.5–5.0)
ALT: 131 U/L — ABNORMAL HIGH (ref 0–44)
AST: 64 U/L — ABNORMAL HIGH (ref 15–41)
Alkaline Phosphatase: 87 U/L (ref 38–126)
Bilirubin, Direct: 0.1 mg/dL (ref 0.0–0.2)
Indirect Bilirubin: 0.6 mg/dL (ref 0.3–0.9)
Total Bilirubin: 0.7 mg/dL (ref 0.3–1.2)
Total Protein: 5.9 g/dL — ABNORMAL LOW (ref 6.5–8.1)

## 2018-12-20 LAB — BASIC METABOLIC PANEL
Anion gap: 7 (ref 5–15)
BUN: 21 mg/dL — ABNORMAL HIGH (ref 6–20)
CHLORIDE: 117 mmol/L — AB (ref 98–111)
CO2: 27 mmol/L (ref 22–32)
Calcium: 8.2 mg/dL — ABNORMAL LOW (ref 8.9–10.3)
Creatinine, Ser: 0.7 mg/dL (ref 0.61–1.24)
GFR calc Af Amer: >60 mL/min (ref >60)
Glucose, Bld: 178 mg/dL — ABNORMAL HIGH (ref 70–99)
Potassium: 3.7 mmol/L (ref 3.5–5.1)
SODIUM: 151 mmol/L — AB (ref 135–145)

## 2018-12-20 LAB — CBC
HCT: 37.8 % — ABNORMAL LOW (ref 39.0–52.0)
Hemoglobin: 11.9 g/dL — ABNORMAL LOW (ref 13.0–17.0)
MCH: 32.4 pg (ref 26.0–34.0)
MCHC: 31.5 g/dL (ref 30.0–36.0)
MCV: 103 fL — ABNORMAL HIGH (ref 80.0–100.0)
Platelets: 312 10*3/uL (ref 150–400)
RBC: 3.67 MIL/uL — ABNORMAL LOW (ref 4.22–5.81)
RDW: 13 % (ref 11.5–15.5)
WBC: 22.9 10*3/uL — ABNORMAL HIGH (ref 4.0–10.5)
nRBC: 0.3 % — ABNORMAL HIGH (ref 0.0–0.2)

## 2018-12-20 LAB — URIC ACID: Uric Acid, Serum: 2.5 mg/dL — ABNORMAL LOW (ref 3.7–8.6)

## 2018-12-20 LAB — SODIUM
Sodium: 152 mmol/L — ABNORMAL HIGH (ref 135–145)
Sodium: 153 mmol/L — ABNORMAL HIGH (ref 135–145)

## 2018-12-20 LAB — GAMMA GT: GGT: 43 U/L (ref 7–50)

## 2018-12-20 MED ORDER — OSMOLITE 1.2 CAL PO LIQD
1000.0000 mL | ORAL | Status: DC
  Administered 2018-12-20 – 2018-12-23 (×5): 1000 mL
  Filled 2018-12-20 (×5): qty 1000

## 2018-12-20 MED ORDER — ACETYLCYSTEINE 20 % IN SOLN
4.0000 mL | Freq: Four times a day (QID) | RESPIRATORY_TRACT | Status: AC
  Administered 2018-12-20 – 2018-12-22 (×8): 4 mL via RESPIRATORY_TRACT
  Filled 2018-12-20 (×8): qty 4

## 2018-12-20 MED ORDER — SODIUM CHLORIDE 23.4 % INJECTION (4 MEQ/ML) FOR IV ADMINISTRATION
120.0000 meq | Freq: Once | INTRAVENOUS | Status: AC
  Administered 2018-12-20: 120 meq via INTRAVENOUS
  Filled 2018-12-20: qty 30

## 2018-12-20 NOTE — Progress Notes (Signed)
NAME:  Joshua Beck, MRN:  [id number], DOB:  1966/10/16, LOS: 7 ADMISSION DATE:  12/21/2018, CONSULTATION DATE:  12/12/2018 REFERRING MD:  Glenda Chroman, CHIEF COMPLAINT:  CVA   Brief History   53 y/o male with large L MCA stroke  Past Medical History  Oropharyngeal cancer  Significant Hospital Events   1/22 Admi 1/25 decompressive craniectomy  Consults:  PCCM Neurosurgery  Procedures:  ETT 1/25 >  PICC 1/23 >   Significant Diagnostic Tests:  MRI brain 1/23 >> large acute ischemic CVA LT MCA territory, cytotoxic edema, 3 mm Lt to Rt shift, occlusive thrombus Lt MCA CT head 1/25 >> worsening cerebral edema with 14 mm shift, subfalcine herniation of Lt cingulate gyrus CT head 1/26 >> interval left frontoparietal craniectomy for decompression, L MCA changes consistent with infarct with swelling, decreased mass effect CT head 1/29 > acute nonhemorrhagic pontine infarct vs artifact, evolving acute large left MCA territory infarct without hemorrhagic infarct.  S/p left decompressive craniectomy.  Micro Data:  1/26 resp culture > neg. 1/26 blood >  1/29 sputum >   Antimicrobials:    Interim history/subjective:  No acute events.  Objective   Blood pressure (!) 167/99, pulse 84, temperature 100.2 F (37.9 C), temperature source Axillary, resp. rate (!) 25, height 6' (1.829 m), weight 68.4 kg, SpO2 94 %.    Vent Mode: CPAP;PSV FiO2 (%):  [40 %] 40 % Set Rate:  [14 bmp] 14 bmp Vt Set:  [620 mL] 620 mL PEEP:  [5 cmH20] 5 cmH20 Pressure Support:  [5 cmH20] 5 cmH20 Plateau Pressure:  [17 cmH20-21 cmH20] 21 cmH20   Intake/Output Summary (Last 24 hours) at 12/20/2018 0933 Last data filed at 12/20/2018 0916 Gross per 24 hour  Intake 2020.89 ml  Output 1800 ml  Net 220.89 ml   Filed Weights   11/30/2018 2117 12/14/2018 0500 12/20/18 0412  Weight: 69.6 kg 67.5 kg 68.4 kg   Examination:  General:  In bed on vent, in NAD HENT: Surgical staples in place. ETT in place PULM:  Respirations even.  Coarse bilaterally, thick secretions in ETT CV: RRR, no mgr GI: BS+, soft, non-tender, skull flap in RLQ MSK: normal bulk and tone Neuro: Opens eyes to voice, follows some basic commands   Assessment & Plan:   Acute respiratory failure with hypoxemia - improved 1/28 and 1/29; however, has copious secretions from ETT. - Continue full mechanical vent support. - VAP prevention. - Daily WUA/SBT but defer extubation for now given secretions. - Add mucomyst nebs. - Repeat tracheal aspirate. - Follow CXR.  L MCA ischemic stroke with cerebral edema, c/p decompressive craniotomy; however, stroke continues to evolve. - Continue hypertonic saline per neuro. - Neurology to discuss with neurosurgery regarding additional surgical intervention. - Neurology to discuss goals of care with family, discussed with Dr. Erlinda Hong.  Persistent acute encephalopathy 1/28: presumably related to stroke. - Continue supportive care.  Hypokalemia/hypophosphatemia > improved. - Continue scheduled KPhos.  Hyperglycemia. - Continue SSI.  Leukocytosis - likely reactive, stable over past few days. - Continue to monitor clinically.  Best practice:  Diet: tube feeding Pain/Anxiety/Delirium protocol (if indicated): RASS goal 0 VAP protocol (if indicated): yes DVT prophylaxis: SCD GI prophylaxis: Pantoprazole Glucose control: SSI Mobility: bed rest Code Status: partial code, no CPR Family Communication: none bedside Disposition: remain in ICU   Critical care time: 30 min.    Montey Hora, [facility] Pager: [phone].  If no answer, [phone] 12/20/2018, 9:52 AM

## 2018-12-20 NOTE — Progress Notes (Signed)
STROKE TEAM PROGRESS NOTE   INTERVAL HISTORY No family at bedside. Pt not open eyes on voice, able to maintain eye opening. Left orbit edema improving but persist. CT head repeat showed worsening MLS to 12cm. Na 151.    Vitals:   12/20/18 0809 12/20/18 0915 12/20/18 1000 12/20/18 1138  BP: (!) 155/95 (!) 167/99  (!) 180/110  Pulse: 84   (!) 121  Resp: (!) 25   (!) 24  Temp:   99.2 F (37.3 C)   TempSrc:   Axillary   SpO2: 94%     Weight:      Height:        CBC:  Recent Labs  Lab 12/15/18 1600  12/17/18 1033  12/19/18 0359 12/20/18 0413  WBC 31.2*   < > 18.3*   < > 22.2* 22.9*  NEUTROABS 28.1*  --  15.7*  --   --   --   HGB 12.3*   < > 10.3*   < > 11.8* 11.9*  HCT 38.5*   < > 33.9*   < > 38.1* 37.8*  MCV 101.3*   < > 104.3*   < > 103.3* 103.0*  PLT 271   < > 249   < > 285 312   < > = values in this interval not displayed.    Basic Metabolic Panel:  Recent Labs  Lab 12/18/18 0336  12/19/18 0359  12/19/18 2242 12/20/18 0413  NA 157*   < > 153*   < > 151* 151*  K 3.6  --  3.5  --   --  3.7  CL 126*  --  117*  --   --  117*  CO2 27  --  28  --   --  27  GLUCOSE 125*  --  144*  --   --  178*  BUN 23*  --  21*  --   --  21*  CREATININE 0.84  --  0.77  --   --  0.70  CALCIUM 8.2*  --  8.4*  --   --  8.2*  MG 2.4  --  2.4  --   --   --   PHOS 2.4*  --  3.5  --   --   --    < > = values in this interval not displayed.   Lipid Panel:     Component Value Date/Time   CHOL 212 (H) 12/14/2018 0004   TRIG 170 (H) 12/19/2018 1628   HDL 50 12/14/2018 0004   CHOLHDL 4.2 12/14/2018 0004   VLDL 12 12/14/2018 0004   LDLCALC 150 (H) 12/14/2018 0004   HgbA1c:  Lab Results  Component Value Date   HGBA1C 6.1 (H) 12/14/2018   Urine Drug Screen:     Component Value Date/Time   LABOPIA POSITIVE (A) 12/17/2018 1156   COCAINSCRNUR NONE DETECTED 12/17/2018 1156   LABBENZ NONE DETECTED 12/17/2018 1156   AMPHETMU NONE DETECTED 12/17/2018 1156   THCU NONE DETECTED 12/17/2018  1156   LABBARB NONE DETECTED 12/17/2018 1156    Alcohol Level No results found for: ETH  IMAGING  Ct Head Wo Contrast 11/27/2018 IMPRESSION:  1. Worsening cerebral edema with rightward midline shift now measuring 14 mm.  2. Subfalcine herniation of the left cingulate gyrus.  3. No acute hemorrhage.   Mr Brain Wo Contrast 12/14/2018 IMPRESSION:  1. Large confluent evolving acute ischemic left MCA territory infarct. Associated extensive cytotoxic edema with developing regional mass effect and new 3  mm left-to-right midline shift. No hydrocephalus or ventricular trapping. Associated scattered petechial hemorrhage within the area of infarction without frank hemorrhagic transformation.  2. Evidence for occlusive thrombus throughout the left middle cerebral artery, stable from prior CTA. Abnormal flow void within the left ICA to the cavernous segment may reflect slow flow and/or occlusion.  3. Few additional subcentimeter ischemic infarcts within the right cerebral hemisphere as above, likely embolic.   Transthoracic Echocardiogram  12/14/2018 Study Conclusions - Left ventricle: The cavity size was normal. Systolic function was   normal. The estimated ejection fraction was in the range of 60%   to 65%. Wall motion was normal; there were no regional wall   motion abnormalities. Left ventricular diastolic function   parameters were normal. - Atrial septum: No defect or patent foramen ovale was identified. Impressions: - Normal study. No cardiac source of emboli was indentified.  Ct Head Wo Contrast  Result Date: 12/17/2018 CLINICAL DATA:  Left hemisphere stroke. Craniotomy for decompression. EXAM: CT HEAD WITHOUT CONTRAST TECHNIQUE: Contiguous axial images were obtained from the base of the skull through the vertex without intravenous contrast. COMPARISON:  12/14/2018 FINDINGS: Brain: Interval left frontoparietal craniectomy for decompression. Extreme low-density in the complete left MCA  territory consistent with completed infarction with swelling. No sign of hemorrhage. Subsequent to the craniectomy, there is less mass effect with left-to-right shift of 11.5 mm on this study compared with 15.5 mm on the previous scan. No ventricular trapping. No extra-axial collection. No new infarction. Vascular: No acute vascular finding. Hyperdense left MCA as seen previously. Skull: Left frontoparietal craniectomy as noted above. Sinuses/Orbits: Some inflammatory changes of the maxillary sinuses as noted previously. Orbits negative. Other: None IMPRESSION: Interval left frontoparietal craniectomy for decompression. Extreme low-density in the complete left MCA territory consistent with completed infarction with swelling. Decreased mass effect with left-to-right shift of 11.5 mm on this study compared with 15.5 mm on the previous scan. Electronically Signed   By: Nelson Chimes M.D.   On: 12/17/2018 17:41   Ct Head Wo Contrast  Result Date: 12/17/2018 CLINICAL DATA:  Left hemisphere stroke. Craniotomy for decompression. EXAM: CT HEAD WITHOUT CONTRAST TECHNIQUE: Contiguous axial images were obtained from the base of the skull through the vertex without intravenous contrast. COMPARISON:  12/20/2018 FINDINGS: Brain: Interval left frontoparietal craniectomy for decompression. Extreme low-density in the complete left MCA territory consistent with completed infarction with swelling. No sign of hemorrhage. Subsequent to the craniectomy, there is less mass effect with left-to-right shift of 11.5 mm on this study compared with 15.5 mm on the previous scan. No ventricular trapping. No extra-axial collection. No new infarction. Vascular: No acute vascular finding. Hyperdense left MCA as seen previously. Skull: Left frontoparietal craniectomy as noted above. Sinuses/Orbits: Some inflammatory changes of the maxillary sinuses as noted previously. Orbits negative. Other: None IMPRESSION: Interval left frontoparietal  craniectomy for decompression. Extreme low-density in the complete left MCA territory consistent with completed infarction with swelling. Decreased mass effect with left-to-right shift of 11.5 mm on this study compared with 15.5 mm on the previous scan. Electronically Signed   By: Nelson Chimes M.D.   On: 12/17/2018 17:41    PHYSICAL EXAM  Temp:  [98 F (36.7 C)-100.9 F (38.3 C)] 99.2 F (37.3 C) (01/29 1000) Pulse Rate:  [67-121] 121 (01/29 1138) Resp:  [16-27] 24 (01/29 1138) BP: (139-180)/(80-110) 180/110 (01/29 1138) SpO2:  [94 %-100 %] 94 % (01/29 0809) FiO2 (%):  [40 %] 40 % (01/29 1138) Weight:  [68.4  kg] 68.4 kg (01/29 0412)  General - Well nourished, well developed, intubated off sedation.  Ophthalmologic - fundi not visualized due to noncooperation.  Cardiovascular - Regular rate and rhythm, tachycardia.  Neuro - intubated off sedation, eyes open with voice, not following commands. Eyes in left gaze position, doll's eye present, not blinking to visual threat bilaterally, not tracking, PERRL. Corneal reflex present on the left but not on the right, gag and cough present. Breathing over the vent.  Facial symmetry not able to test due to ET tube.  Tongue midline in mouth. On pain stimulation, withdraw in left upper and lower extremities against gravity, 0/5 in right upper extremity but 2/5 left lower extremity on pain stimulation. DTR 1+ and babinski positive on the right. Sensation, coordination and gait not tested.   ASSESSMENT/PLAN Mr. SAULO ANTHIS is a 53 y.o. 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 Not a candidate for TPA d/t presenting outside of TPA window. Not a candidate for IR given core of 194 ml.  Enrolled in the CHARM trial (IV glyburide versus placebo for cerebral edema).  Stroke:  large left MCA infarct secondary with cytotoxic cerebral edema and petechial hemorrhage - embolic - source unknown, could be due to bilateral ICA high grade stenosis/occlusion - enrolled in CHARM  CT head no acute hemorrhage; large area of acute ischemia in the left MCA territory with hyperdense left MCA. Aspects 3.  CTA head & neck Complete occlusion of the left middle cerebral artery with no collateralization. Diffuse narrowing of the left carotid system, with short segment occlusion of the distal left common carotid artery.70% stenosis of the right internal carotid artery at the bifurcation.   CT perfusion: Large core infarct of 194 mL in the left MCA territory.  Repeat CT head with evolving large left MCA territory infarct there which has now propagated to the left basal ganglia   MRI large evolving left MCA territory infarct with associated extensive cytotoxic edema with developing regional mass-effect. Few additional right cerebral hemisphere subcentimeter infarcts, likely embolic  2D Echo  - EF 60 - 65%. No cardiac source of emboli identified.   LDL 150  HgbA1c 6.1  UDS - positive for opiates (on MS Contin at home)  Heparin subq for VTE prophylaxis  aspirin 81 mg daily prior to admission, now on Aspirin 325 mg  Therapy recommendations:  pending  Disposition:  Discussed with daughter Earnest Bailey and she stated that pt had discussed with her in the past that he does not want trach so daughter will consider one way extubation without re-intubation if pt not tolerating extubation.   Cerebral edema  Induced hypernatremia  CT repeat 12/03/2018 - significant MLS  NSG Dr. Kathyrn Sheriff consulted and s/p hemi-crani 12/22/2018  CT head repeat 12/17/18 - improved MLS, but still has shift at 11.5 mm  CT head repeat 12/19/18 - worsening MLS to 12.61mm  on 3% protocol for cerebral edema control  Sodium 156->158->154->157->160->157->153->151  increase 3% at 75  Na goal 150-160  Check NA  q6h  PICC line placed - give 23.4% once   Carotid stenosis  CTA head neck - Diffuse narrowing of the left carotid system, with short segment occlusion of the distal left common carotid artery.70% stenosis of the right internal carotid artery at the bifurcation.  On ASA  Respiratory failure  Intubated for airway protection and for surgery   CCM on board  On weaning trial  Extubate as able  Severe leukocytosis  WBC 33.4->31.2->30.5->19.6->18.3->22.9-> 22.2->22.9  Tmax 100.9  Likely reactive  Close monitoring  Hypertension  Stable now  On lisinopril 20mg  bid . BP goal < 180  Hyperlipidemia  Home meds:  none  LDL 150, goal < 70  Add atorvastatin 40  Continue statin at discharge  Other Stroke Risk Factors  Cigarette smoker advised to stop smoking  Hx stroke - 2011 without residue  Other Active Problems  Hypokalemia 3.4- replace->3.4 -> supplemented->3.6-3.5-3.7  Dysphagia - on TF @ 40   This patient is critically ill and at significant risk of neurological worsening, death and care requires constant monitoring of vital signs, hemodynamics,respiratory and cardiac monitoring, extensive review of multiple databases, frequent neurological assessment, discussion with family, other specialists and medical decision making of high complexity. I spent 45 minutes of neurocritical care time in the care of  this patient. I had long discussion with daughter Earnest Bailey over the phone, updated pt current condition, treatment plan and potential poor prognosis. She expressed understanding and appreciation and she stated that pt would not want trach and she would lean to one way extubation without re-intubation if pt can not tolerate extubation. I also discussed with Dr. Lake Bells.    Rosalin Hawking, MD PhD Stroke Neurology 12/20/2018 11:40 AM   To contact Stroke Continuity provider, please refer to [email or web address] After hours, contact General Neurology

## 2018-12-21 ENCOUNTER — Inpatient Hospital Stay (HOSPITAL_COMMUNITY): Payer: Medicare Other

## 2018-12-21 DIAGNOSIS — I10 Essential (primary) hypertension: Secondary | ICD-10-CM

## 2018-12-21 LAB — SODIUM
SODIUM: 176 mmol/L — AB (ref 135–145)
SODIUM: 157 mmol/L — AB (ref 135–145)
Sodium: 154 mmol/L — ABNORMAL HIGH (ref 135–145)
Sodium: 155 mmol/L — ABNORMAL HIGH (ref 135–145)
Sodium: 158 mmol/L — ABNORMAL HIGH (ref 135–145)

## 2018-12-21 LAB — BASIC METABOLIC PANEL
Anion gap: 7 (ref 5–15)
BUN: 19 mg/dL (ref 6–20)
CO2: 24 mmol/L (ref 22–32)
Calcium: 7.7 mg/dL — ABNORMAL LOW (ref 8.9–10.3)
Chloride: 124 mmol/L — ABNORMAL HIGH (ref 98–111)
Creatinine, Ser: 0.66 mg/dL (ref 0.61–1.24)
GFR calc Af Amer: >60 mL/min (ref >60)
GFR calc non Af Amer: >60 mL/min (ref >60)
Glucose, Bld: 141 mg/dL — ABNORMAL HIGH (ref 70–99)
Potassium: 3.4 mmol/L — ABNORMAL LOW (ref 3.5–5.1)
Sodium: 155 mmol/L — ABNORMAL HIGH (ref 135–145)

## 2018-12-21 LAB — GLUCOSE, CAPILLARY
Glucose-Capillary: 119 mg/dL — ABNORMAL HIGH (ref 70–99)
Glucose-Capillary: 120 mg/dL — ABNORMAL HIGH (ref 70–99)
Glucose-Capillary: 126 mg/dL — ABNORMAL HIGH (ref 70–99)
Glucose-Capillary: 130 mg/dL — ABNORMAL HIGH (ref 70–99)
Glucose-Capillary: 140 mg/dL — ABNORMAL HIGH (ref 70–99)
Glucose-Capillary: 140 mg/dL — ABNORMAL HIGH (ref 70–99)
Glucose-Capillary: 150 mg/dL — ABNORMAL HIGH (ref 70–99)

## 2018-12-21 LAB — MAGNESIUM: Magnesium: 2.2 mg/dL (ref 1.7–2.4)

## 2018-12-21 LAB — PHOSPHORUS: Phosphorus: 3 mg/dL (ref 2.5–4.6)

## 2018-12-21 MED ORDER — AMLODIPINE BESYLATE 10 MG PO TABS
10.0000 mg | ORAL_TABLET | Freq: Every day | ORAL | Status: DC
  Administered 2018-12-21 – 2018-12-23 (×3): 10 mg via ORAL
  Filled 2018-12-21 (×3): qty 1

## 2018-12-21 MED ORDER — POTASSIUM CHLORIDE 20 MEQ/15ML (10%) PO SOLN
40.0000 meq | Freq: Once | ORAL | Status: AC
  Administered 2018-12-21: 40 meq
  Filled 2018-12-21: qty 30

## 2018-12-21 MED ORDER — CHLORHEXIDINE GLUCONATE CLOTH 2 % EX PADS
6.0000 | MEDICATED_PAD | Freq: Every day | CUTANEOUS | Status: DC
  Administered 2018-12-22 – 2018-12-23 (×2): 6 via TOPICAL

## 2018-12-21 NOTE — Progress Notes (Addendum)
STROKE TEAM PROGRESS NOTE   INTERVAL HISTORY No family at bedside. Pt open eyes spontaneously, able to maintain eye opening, more awake and alert.  Na 155.  Not able to be extubated, still has copious secretion.   Vitals:   12/21/18 0732 12/21/18 0800 12/21/18 1110 12/21/18 1125  BP: 138/90 (!) 158/90 (!) 163/99 (!) 163/99  Pulse: 88 84  95  Resp: (!) 23 (!) 21  (!) 26  Temp:  98.5 F (36.9 C)    TempSrc:  Axillary    SpO2: 99% 97%  98%  Weight:      Height:        CBC:  Recent Labs  Lab 12/17/18 1033  12/20/18 0413 12/20/18 1720  WBC 18.3*   < > 22.9* 25.8*  NEUTROABS 15.7*  --   --  20.9*  HGB 10.3*   < > 11.9* 12.4*  HCT 33.9*   < > 37.8* 39.5  MCV 104.3*   < > 103.0* 102.3*  PLT 249   < > 312 319   < > = values in this interval not displayed.    Basic Metabolic Panel:  Recent Labs  Lab 12/19/18 0359  12/20/18 0413  12/20/18 2207 12/21/18 0508  NA 153*   < > 151*   < > 154* 155*  K 3.5  --  3.7  --   --  3.4*  CL 117*  --  117*  --   --  124*  CO2 28  --  27  --   --  24  GLUCOSE 144*  --  178*  --   --  141*  BUN 21*  --  21*  --   --  19  CREATININE 0.77  --  0.70  --   --  0.66  CALCIUM 8.4*  --  8.2*  --   --  7.7*  MG 2.4  --   --   --   --  2.2  PHOS 3.5  --   --   --   --  3.0   < > = values in this interval not displayed.   Lipid Panel:     Component Value Date/Time   CHOL 212 (H) 12/14/2018 0004   TRIG 170 (H) 12/19/2018 1628   HDL 50 12/14/2018 0004   CHOLHDL 4.2 12/14/2018 0004   VLDL 12 12/14/2018 0004   LDLCALC 150 (H) 12/14/2018 0004   HgbA1c:  Lab Results  Component Value Date   HGBA1C 6.1 (H) 12/14/2018   Urine Drug Screen:     Component Value Date/Time   LABOPIA POSITIVE (A) 12/17/2018 1156   COCAINSCRNUR NONE DETECTED 12/17/2018 1156   LABBENZ NONE DETECTED 12/17/2018 1156   AMPHETMU NONE DETECTED 12/17/2018 1156   THCU NONE DETECTED 12/17/2018 1156   LABBARB NONE DETECTED 12/17/2018 1156    Alcohol Level No results  found for: ETH  IMAGING  Ct Head Wo Contrast 12/11/2018 IMPRESSION:  1. Worsening cerebral edema with rightward midline shift now measuring 14 mm.  2. Subfalcine herniation of the left cingulate gyrus.  3. No acute hemorrhage.   Mr Brain Wo Contrast 12/14/2018 IMPRESSION:  1. Large confluent evolving acute ischemic left MCA territory infarct. Associated extensive cytotoxic edema with developing regional mass effect and new 3 mm left-to-right midline shift. No hydrocephalus or ventricular trapping. Associated scattered petechial hemorrhage within the area of infarction without frank hemorrhagic transformation.  2. Evidence for occlusive thrombus throughout the left middle cerebral artery, stable from  prior CTA. Abnormal flow void within the left ICA to the cavernous segment may reflect slow flow and/or occlusion.  3. Few additional subcentimeter ischemic infarcts within the right cerebral hemisphere as above, likely embolic.   Transthoracic Echocardiogram  12/14/2018 Study Conclusions - Left ventricle: The cavity size was normal. Systolic function was   normal. The estimated ejection fraction was in the range of 60%   to 65%. Wall motion was normal; there were no regional wall   motion abnormalities. Left ventricular diastolic function   parameters were normal. - Atrial septum: No defect or patent foramen ovale was identified. Impressions: - Normal study. No cardiac source of emboli was indentified.  Ct Head Wo Contrast  Result Date: 12/17/2018 CLINICAL DATA:  Left hemisphere stroke. Craniotomy for decompression. EXAM: CT HEAD WITHOUT CONTRAST TECHNIQUE: Contiguous axial images were obtained from the base of the skull through the vertex without intravenous contrast. COMPARISON:  12/04/2018 FINDINGS: Brain: Interval left frontoparietal craniectomy for decompression. Extreme low-density in the complete left MCA territory consistent with completed infarction with swelling. No sign of  hemorrhage. Subsequent to the craniectomy, there is less mass effect with left-to-right shift of 11.5 mm on this study compared with 15.5 mm on the previous scan. No ventricular trapping. No extra-axial collection. No new infarction. Vascular: No acute vascular finding. Hyperdense left MCA as seen previously. Skull: Left frontoparietal craniectomy as noted above. Sinuses/Orbits: Some inflammatory changes of the maxillary sinuses as noted previously. Orbits negative. Other: None IMPRESSION: Interval left frontoparietal craniectomy for decompression. Extreme low-density in the complete left MCA territory consistent with completed infarction with swelling. Decreased mass effect with left-to-right shift of 11.5 mm on this study compared with 15.5 mm on the previous scan. Electronically Signed   By: Nelson Chimes M.D.   On: 12/17/2018 17:41   Ct Head Wo Contrast  Result Date: 12/17/2018 CLINICAL DATA:  Left hemisphere stroke. Craniotomy for decompression. EXAM: CT HEAD WITHOUT CONTRAST TECHNIQUE: Contiguous axial images were obtained from the base of the skull through the vertex without intravenous contrast. COMPARISON:  12/12/2018 FINDINGS: Brain: Interval left frontoparietal craniectomy for decompression. Extreme low-density in the complete left MCA territory consistent with completed infarction with swelling. No sign of hemorrhage. Subsequent to the craniectomy, there is less mass effect with left-to-right shift of 11.5 mm on this study compared with 15.5 mm on the previous scan. No ventricular trapping. No extra-axial collection. No new infarction. Vascular: No acute vascular finding. Hyperdense left MCA as seen previously. Skull: Left frontoparietal craniectomy as noted above. Sinuses/Orbits: Some inflammatory changes of the maxillary sinuses as noted previously. Orbits negative. Other: None IMPRESSION: Interval left frontoparietal craniectomy for decompression. Extreme low-density in the complete left MCA  territory consistent with completed infarction with swelling. Decreased mass effect with left-to-right shift of 11.5 mm on this study compared with 15.5 mm on the previous scan. Electronically Signed   By: Nelson Chimes M.D.   On: 12/17/2018 17:41    PHYSICAL EXAM  Temp:  [98.1 F (36.7 C)-100.3 F (37.9 C)] 98.5 F (36.9 C) (01/30 0800) Pulse Rate:  [77-104] 95 (01/30 1125) Resp:  [19-29] 26 (01/30 1125) BP: (138-189)/(89-111) 163/99 (01/30 1125) SpO2:  [92 %-99 %] 98 % (01/30 1125) FiO2 (%):  [40 %] 40 % (01/30 1125) Weight:  [67.2 kg] 67.2 kg (01/30 0400)  General - Well nourished, well developed, intubated off sedation.  Ophthalmologic - fundi not visualized due to noncooperation.  Cardiovascular - Regular rate and rhythm.  Neuro - intubated off  sedation, eyes open spontaneously, but not following commands. Eyes in left gaze position, doll's eye present, blinking to be described on the left, not blinking to visual threat to the right, not tracking, PERRL. Corneal reflex present on the left but not on the right, gag and cough present. Breathing over the vent.  Facial symmetry not able to test due to ET tube.  Tongue midline in mouth. On pain stimulation, withdraw in left upper and lower extremities against gravity, 1/5 in right upper extremity but 2/5 left lower extremity on pain stimulation. DTR 1+ and babinski positive on the right. Sensation, coordination and gait not tested.   ASSESSMENT/PLAN Mr. YAHEL FUSTON is a 53 y.o. 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 Not a candidate for TPA d/t presenting outside of TPA window. Not a candidate for IR given core of 194 ml.  Enrolled in the CHARM trial (IV glyburide versus placebo for cerebral edema).  Stroke: large left MCA infarct secondary with  cytotoxic cerebral edema and petechial hemorrhage - embolic - source unknown, could be due to bilateral ICA high grade stenosis/occlusion - enrolled in CHARM  CT head no acute hemorrhage; large area of acute ischemia in the left MCA territory with hyperdense left MCA. Aspects 3.  CTA head & neck Complete occlusion of the left middle cerebral artery with no collateralization. Diffuse narrowing of the left carotid system, with short segment occlusion of the distal left common carotid artery.70% stenosis of the right internal carotid artery at the bifurcation.   CT perfusion: Large core infarct of 194 mL in the left MCA territory.  Repeat CT head with evolving large left MCA territory infarct there which has now propagated to the left basal ganglia   MRI large evolving left MCA territory infarct with associated extensive cytotoxic edema with developing regional mass-effect. Few additional right cerebral hemisphere subcentimeter infarcts, likely embolic  2D Echo  - EF 60 - 65%. No cardiac source of emboli identified.   LDL 150  HgbA1c 6.1  UDS - positive for opiates (on MS Contin at home)  Heparin subq for VTE prophylaxis  aspirin 81 mg daily prior to admission, now on Aspirin 325 mg  Therapy recommendations:  pending  Disposition:  Discussed with daughter Earnest Bailey and she stated that pt had discussed with her in the past that he does not want trach so daughter will consider one way extubation without re-intubation if pt not able to be extubated.   Cerebral edema  Induced hypernatremia  CT repeat 12/15/2018 - significant MLS  NSG Dr. Kathyrn Sheriff consulted and s/p hemi-crani 12/02/2018  CT head repeat 12/17/18 - improved MLS, but still has shift at 11.5 mm  CT head repeat 12/19/18 - worsening MLS to 12.81mm  on 3% protocol for cerebral edema control  Sodium 156->158->154->157->160->157->153->151->155  Continue 3% at 75, status post 23.4% saline once on 12/20/2018  Na goal 150-160  Check NA  q6h  CT repeat in a.m.  Carotid stenosis  CTA head neck - Diffuse narrowing of the left carotid system, with short segment occlusion of the distal left common carotid artery.70% stenosis of the right internal carotid artery at the bifurcation.  On ASA  Respiratory failure  Intubated for airway protection and for surgery   CCM on board  On weaning trial  Extubate as able  Severe leukocytosis  WBC 33.4->31.2->30.5->19.6->18.3->22.9-> 22.2->22.9-25.8  Tmax 100.9->afebrile  Likely reactive  Close monitoring  Hypertension  On the high end  On lisinopril 20mg  bid  Add amlodipine 10 . BP goal < 180  Hyperlipidemia  Home meds:  none  LDL 150, goal < 70  Was on atorvastatin 40  ALT 16-49-131  AST 21-43-64  DC Lipitor at this time  Continue monitoring  Other Stroke Risk Factors  Cigarette smoker advised to stop smoking  Hx stroke - 2011 without residue  Other Active Problems  Hypokalemia 3.4- replace->3.4 -> supplemented->3.6-3.5-3.7->3.4 supplement  Dysphagia - on TF @ 40   This patient is critically ill and at significant risk of neurological worsening, death and care requires constant monitoring of vital signs, hemodynamics,respiratory and cardiac monitoring, extensive review of multiple databases, frequent neurological assessment, discussion with family, other specialists and medical decision making of high complexity. I spent 35 minutes of neurocritical care time in the care of  this patient.    Rosalin Hawking, MD PhD Stroke Neurology 12/21/2018 12:16 PM   To contact Stroke Continuity provider, please refer to [email or web address] After hours, contact General Neurology

## 2018-12-21 NOTE — Progress Notes (Signed)
NAME:  Joshua Beck, MRN:  [id number], DOB:  01-Jan-1966, LOS: 1 ADMISSION DATE:  12/08/2018, CONSULTATION DATE:  12/10/2018 REFERRING MD:  Glenda Chroman, CHIEF COMPLAINT:  CVA   Brief History   53 y/o male with large L MCA stroke  Past Medical History  Oropharyngeal cancer  Significant Hospital Events   1/22 Admi 1/25 decompressive craniectomy  Consults:  PCCM Neurosurgery  Procedures:  ETT 1/25 >  PICC 1/23 >   Significant Diagnostic Tests:  MRI brain 1/23 >> large acute ischemic CVA LT MCA territory, cytotoxic edema, 3 mm Lt to Rt shift, occlusive thrombus Lt MCA CT head 1/25 >> worsening cerebral edema with 14 mm shift, subfalcine herniation of Lt cingulate gyrus CT head 1/26 >> interval left frontoparietal craniectomy for decompression, L MCA changes consistent with infarct with swelling, decreased mass effect CT head 1/29 > acute nonhemorrhagic pontine infarct vs artifact, evolving acute large left MCA territory infarct without hemorrhagic infarct.  S/p left decompressive craniectomy.  Micro Data:  1/26 resp culture > neg. 1/26 blood >  1/29 sputum >   Antimicrobials:    Interim history/subjective:  No acute events. Opens eyes this AM but appears to not follow as many commands as he did yesterday. Secretions improved.  Family opting for one way extubation Saturday 2/1.  Objective   Blood pressure 138/90, pulse 88, temperature 98.5 F (36.9 C), temperature source Axillary, resp. rate (!) 23, height 6' (1.829 m), weight 67.2 kg, SpO2 99 %.    Vent Mode: PSV;CPAP FiO2 (%):  [40 %] 40 % Set Rate:  [14 bmp] 14 bmp Vt Set:  [620 mL] 620 mL PEEP:  [5 cmH20] 5 cmH20 Pressure Support:  [8 cmH20-10 cmH20] 10 cmH20 Plateau Pressure:  [17 cmH20-21 cmH20] 21 cmH20   Intake/Output Summary (Last 24 hours) at 12/21/2018 0958 Last data filed at 12/21/2018 0800 Gross per 24 hour  Intake 1616.85 ml  Output 2575 ml  Net -958.15 ml   Filed Weights   12/01/2018 0500  12/20/18 0412 12/21/18 0400  Weight: 67.5 kg 68.4 kg 67.2 kg   Examination:  General:  Adult male, in NAD. HENT: Surgical staples in place. ETT in place PULM: Respirations even.  Secretions improved.  Coarse in bases. CV: RRR, no mgr GI: BS+, soft, non-tender, skull flap in RLQ MSK: normal bulk and tone Neuro: Opens eyes to voice, follows some basic commands   Assessment & Plan:   Acute respiratory failure with hypoxemia - improved 1/28 and 1/29; however, has copious secretions from ETT. - Continue vent support. - One way extubation being planned for Saturday 2/1 once all family arrives (confirmed this with daughter Earnest Bailey over the phone 1/30). - VAP prevention. - Continue mucomyst nebs. - Repeat tracheal aspirate. - Follow CXR.  L MCA ischemic stroke with cerebral edema, c/p decompressive craniotomy; however, stroke continues to evolve. - Continue hypertonic saline per neuro. - Neurology managing. - Neurology to discuss with neurosurgery regarding additional surgical intervention.  Persistent acute encephalopathy 1/28: presumably related to stroke. - Continue supportive care.  Hypokalemia. - Continue scheduled KPhos. - 40 mEq K per tube now.  Hyperglycemia. - Continue SSI.  Leukocytosis - likely reactive, stable over past few days.  Had thick secretions but no fevers and CXR not convincing for infxn. - Continue to monitor clinically.  Best practice:  Diet: tube feeding Pain/Anxiety/Delirium protocol (if indicated): RASS goal 0 VAP protocol (if indicated): yes DVT prophylaxis: SCD GI prophylaxis: Pantoprazole Glucose control: SSI Mobility: bed rest Code  Status: partial code, no CPR Family Communication: none bedside Disposition: remain in ICU   Critical care time: 30 min.    Montey Hora, [facility] Pager: [phone].  If no answer, [phone] 12/21/2018, 9:58 AM

## 2018-12-22 ENCOUNTER — Inpatient Hospital Stay (HOSPITAL_COMMUNITY): Payer: Medicare Other

## 2018-12-22 DIAGNOSIS — R740 Nonspecific elevation of levels of transaminase and lactic acid dehydrogenase [LDH]: Secondary | ICD-10-CM

## 2018-12-22 LAB — CBC
HEMATOCRIT: 37.8 % — AB (ref 39.0–52.0)
Hemoglobin: 11.7 g/dL — ABNORMAL LOW (ref 13.0–17.0)
MCH: 33.1 pg (ref 26.0–34.0)
MCHC: 31 g/dL (ref 30.0–36.0)
MCV: 106.8 fL — AB (ref 80.0–100.0)
PLATELETS: 334 10*3/uL (ref 150–400)
RBC: 3.54 MIL/uL — ABNORMAL LOW (ref 4.22–5.81)
RDW: 13.5 % (ref 11.5–15.5)
WBC: 31.4 10*3/uL — ABNORMAL HIGH (ref 4.0–10.5)
nRBC: 0.1 % (ref 0.0–0.2)

## 2018-12-22 LAB — SODIUM
Sodium: 163 mmol/L (ref 135–145)
Sodium: 163 mmol/L (ref 135–145)
Sodium: 159 mmol/L — ABNORMAL HIGH (ref 135–145)

## 2018-12-22 LAB — BASIC METABOLIC PANEL
Anion gap: 6 (ref 5–15)
BUN: 21 mg/dL — ABNORMAL HIGH (ref 6–20)
CALCIUM: 8.2 mg/dL — AB (ref 8.9–10.3)
CO2: 27 mmol/L (ref 22–32)
CREATININE: 0.74 mg/dL (ref 0.61–1.24)
Chloride: 128 mmol/L — ABNORMAL HIGH (ref 98–111)
GFR calc Af Amer: >60 mL/min (ref >60)
GFR calc non Af Amer: >60 mL/min (ref >60)
Glucose, Bld: 129 mg/dL — ABNORMAL HIGH (ref 70–99)
Potassium: 3.5 mmol/L (ref 3.5–5.1)
Sodium: 161 mmol/L (ref 135–145)

## 2018-12-22 LAB — CULTURE, BLOOD (ROUTINE X 2)
Culture: NO GROWTH
Culture: NO GROWTH

## 2018-12-22 LAB — GLUCOSE, CAPILLARY
Glucose-Capillary: 128 mg/dL — ABNORMAL HIGH (ref 70–99)
Glucose-Capillary: 129 mg/dL — ABNORMAL HIGH (ref 70–99)
Glucose-Capillary: 98 mg/dL (ref 70–99)
Glucose-Capillary: 138 mg/dL — ABNORMAL HIGH (ref 70–99)
Glucose-Capillary: 123 mg/dL — ABNORMAL HIGH (ref 70–99)
Glucose-Capillary: 141 mg/dL — ABNORMAL HIGH (ref 70–99)

## 2018-12-22 LAB — CULTURE, RESPIRATORY W GRAM STAIN
Culture: NORMAL
Gram Stain: NONE SEEN

## 2018-12-22 LAB — HEPATIC FUNCTION PANEL
ALT: 134 U/L — ABNORMAL HIGH (ref 0–44)
AST: 55 U/L — ABNORMAL HIGH (ref 15–41)
Albumin: 2 g/dL — ABNORMAL LOW (ref 3.5–5.0)
Alkaline Phosphatase: 82 U/L (ref 38–126)
Bilirubin, Direct: 0.1 mg/dL (ref 0.0–0.2)
Indirect Bilirubin: 0.2 mg/dL — ABNORMAL LOW (ref 0.3–0.9)
Total Bilirubin: 0.3 mg/dL (ref 0.3–1.2)
Total Protein: 5.6 g/dL — ABNORMAL LOW (ref 6.5–8.1)

## 2018-12-22 LAB — MAGNESIUM: Magnesium: 2.4 mg/dL (ref 1.7–2.4)

## 2018-12-22 LAB — PHOSPHORUS: Phosphorus: 3.1 mg/dL (ref 2.5–4.6)

## 2018-12-22 MED ORDER — PIPERACILLIN-TAZOBACTAM 3.375 G IVPB
3.3750 g | Freq: Three times a day (TID) | INTRAVENOUS | Status: DC
  Administered 2018-12-22 – 2018-12-23 (×4): 3.375 g via INTRAVENOUS
  Filled 2018-12-22 (×5): qty 50

## 2018-12-22 MED ORDER — SODIUM CHLORIDE 0.45 % IV SOLN
INTRAVENOUS | Status: DC
  Administered 2018-12-22: 17:00:00 via INTRAVENOUS

## 2018-12-22 MED ORDER — SODIUM CHLORIDE 0.9 % IV SOLN
INTRAVENOUS | Status: DC
  Administered 2018-12-22: 13:00:00 via INTRAVENOUS

## 2018-12-22 NOTE — Care Management Note (Signed)
Case Management Note  Patient Details  Name: CAEDON BOND MRN: [id number] Date of Birth: 1966/02/09  Subjective/Objective:  Pt admitted on 11/26/2018 with Lt MCA acute ischemic infarct with Lt to Rt midline shift, s/p crani on 11/23/2018.  PTA, pt independent of ADLS.                   Action/Plan: Family has chosen one way extubation on Saturday, 12/23/2018.  RN Case Manager to follow; will provide support to family and staff as needed.  Expected Discharge Date:                  Expected Discharge Plan:     In-House Referral:     Discharge planning Services  CM Consult  Post Acute Care Choice:    Choice offered to:     DME Arranged:    DME Agency:     HH Arranged:    HH Agency:     Status of Service:  In process, will continue to follow  If discussed at Long Length of Stay Meetings, dates discussed:    Additional Comments:  Reinaldo Raddle, RN, BSN  Trauma/Neuro ICU Case Manager [phone]

## 2018-12-22 NOTE — Progress Notes (Signed)
STROKE TEAM PROGRESS NOTE   INTERVAL HISTORY No family at bedside. Pt open eyes on voice, able to maintain eye opening, but not following commands with me. As per RN, he was able to follow eye close opening intermittently.  Na 161.     Vitals:   12/22/18 0810 12/22/18 0817 12/22/18 0900 12/22/18 1000  BP: 125/77  135/82 118/68  Pulse: 81  88 89  Resp: 16  (!) 23 (!) 22  Temp:  99.9 F (37.7 C)    TempSrc:  Axillary    SpO2: 98% 99% 97% 97%  Weight:      Height:        CBC:  Recent Labs  Lab 12/17/18 1033  12/20/18 1720 12/22/18 0529  WBC 18.3*   < > 25.8* 31.4*  NEUTROABS 15.7*  --  20.9*  --   HGB 10.3*   < > 12.4* 11.7*  HCT 33.9*   < > 39.5 37.8*  MCV 104.3*   < > 102.3* 106.8*  PLT 249   < > 319 334   < > = values in this interval not displayed.    Basic Metabolic Panel:  Recent Labs  Lab 12/21/18 0508  12/21/18 2204 12/22/18 0529  NA 155*   < > 158* 161*  K 3.4*  --   --  3.5  CL 124*  --   --  128*  CO2 24  --   --  27  GLUCOSE 141*  --   --  129*  BUN 19  --   --  21*  CREATININE 0.66  --   --  0.74  CALCIUM 7.7*  --   --  8.2*  MG 2.2  --   --  2.4  PHOS 3.0  --   --  3.1   < > = values in this interval not displayed.   Lipid Panel:     Component Value Date/Time   CHOL 212 (H) 12/14/2018 0004   TRIG 170 (H) 12/19/2018 1628   HDL 50 12/14/2018 0004   CHOLHDL 4.2 12/14/2018 0004   VLDL 12 12/14/2018 0004   LDLCALC 150 (H) 12/14/2018 0004   HgbA1c:  Lab Results  Component Value Date   HGBA1C 6.1 (H) 12/14/2018   Urine Drug Screen:     Component Value Date/Time   LABOPIA POSITIVE (A) 12/17/2018 1156   COCAINSCRNUR NONE DETECTED 12/17/2018 1156   LABBENZ NONE DETECTED 12/17/2018 1156   AMPHETMU NONE DETECTED 12/17/2018 1156   THCU NONE DETECTED 12/17/2018 1156   LABBARB NONE DETECTED 12/17/2018 1156    Alcohol Level No results found for: ETH  IMAGING  Ct Head Wo Contrast 11/27/2018 IMPRESSION:  1. Worsening cerebral edema with  rightward midline shift now measuring 14 mm.  2. Subfalcine herniation of the left cingulate gyrus.  3. No acute hemorrhage.   Mr Brain Wo Contrast 12/14/2018 IMPRESSION:  1. Large confluent evolving acute ischemic left MCA territory infarct. Associated extensive cytotoxic edema with developing regional mass effect and new 3 mm left-to-right midline shift. No hydrocephalus or ventricular trapping. Associated scattered petechial hemorrhage within the area of infarction without frank hemorrhagic transformation.  2. Evidence for occlusive thrombus throughout the left middle cerebral artery, stable from prior CTA. Abnormal flow void within the left ICA to the cavernous segment may reflect slow flow and/or occlusion.  3. Few additional subcentimeter ischemic infarcts within the right cerebral hemisphere as above, likely embolic.   Transthoracic Echocardiogram  12/14/2018 Study Conclusions - Left  ventricle: The cavity size was normal. Systolic function was   normal. The estimated ejection fraction was in the range of 60%   to 65%. Wall motion was normal; there were no regional wall   motion abnormalities. Left ventricular diastolic function   parameters were normal. - Atrial septum: No defect or patent foramen ovale was identified. Impressions: - Normal study. No cardiac source of emboli was indentified.  Ct Head Wo Contrast  Result Date: 12/17/2018 CLINICAL DATA:  Left hemisphere stroke. Craniotomy for decompression. EXAM: CT HEAD WITHOUT CONTRAST TECHNIQUE: Contiguous axial images were obtained from the base of the skull through the vertex without intravenous contrast. COMPARISON:  12/15/2018 FINDINGS: Brain: Interval left frontoparietal craniectomy for decompression. Extreme low-density in the complete left MCA territory consistent with completed infarction with swelling. No sign of hemorrhage. Subsequent to the craniectomy, there is less mass effect with left-to-right shift of 11.5 mm on this  study compared with 15.5 mm on the previous scan. No ventricular trapping. No extra-axial collection. No new infarction. Vascular: No acute vascular finding. Hyperdense left MCA as seen previously. Skull: Left frontoparietal craniectomy as noted above. Sinuses/Orbits: Some inflammatory changes of the maxillary sinuses as noted previously. Orbits negative. Other: None IMPRESSION: Interval left frontoparietal craniectomy for decompression. Extreme low-density in the complete left MCA territory consistent with completed infarction with swelling. Decreased mass effect with left-to-right shift of 11.5 mm on this study compared with 15.5 mm on the previous scan. Electronically Signed   By: Nelson Chimes M.D.   On: 12/17/2018 17:41   Ct Head Wo Contrast  Result Date: 12/17/2018 CLINICAL DATA:  Left hemisphere stroke. Craniotomy for decompression. EXAM: CT HEAD WITHOUT CONTRAST TECHNIQUE: Contiguous axial images were obtained from the base of the skull through the vertex without intravenous contrast. COMPARISON:  12/17/2018 FINDINGS: Brain: Interval left frontoparietal craniectomy for decompression. Extreme low-density in the complete left MCA territory consistent with completed infarction with swelling. No sign of hemorrhage. Subsequent to the craniectomy, there is less mass effect with left-to-right shift of 11.5 mm on this study compared with 15.5 mm on the previous scan. No ventricular trapping. No extra-axial collection. No new infarction. Vascular: No acute vascular finding. Hyperdense left MCA as seen previously. Skull: Left frontoparietal craniectomy as noted above. Sinuses/Orbits: Some inflammatory changes of the maxillary sinuses as noted previously. Orbits negative. Other: None IMPRESSION: Interval left frontoparietal craniectomy for decompression. Extreme low-density in the complete left MCA territory consistent with completed infarction with swelling. Decreased mass effect with left-to-right shift of 11.5 mm  on this study compared with 15.5 mm on the previous scan. Electronically Signed   By: Nelson Chimes M.D.   On: 12/17/2018 17:41    PHYSICAL EXAM  Temp:  [97.7 F (36.5 C)-102.3 F (39.1 C)] 99.9 F (37.7 C) (01/31 0817) Pulse Rate:  [78-114] 89 (01/31 1000) Resp:  [16-32] 22 (01/31 1000) BP: (102-154)/(68-97) 118/68 (01/31 1000) SpO2:  [95 %-100 %] 97 % (01/31 1000) FiO2 (%):  [40 %] 40 % (01/31 0817) Weight:  [68.1 kg] 68.1 kg (01/31 0500)  General - Well nourished, well developed, intubated off sedation.  Ophthalmologic - fundi not visualized due to noncooperation.  Cardiovascular - Regular rate and rhythm.  Neuro - intubated off sedation, eyes open on voice, but not following commands. Eyes in left gaze position, doll's eye present, blinking to be described on the left, not blinking to visual threat to the right, not tracking, PERRL. Corneal reflex present on the left but not on the  right, gag and cough present. Breathing over the vent.  Facial symmetry not able to test due to ET tube.  Tongue midline in mouth. On pain stimulation, withdraw in left upper and lower extremities against gravity, 1/5 in right upper extremity but 2-/5 left lower extremity on pain stimulation. DTR 1+ and babinski positive on the right. Sensation, coordination and gait not tested.   ASSESSMENT/PLAN Mr. Joshua Beck is a 53 y.o. 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 Not a candidate for TPA d/t presenting outside of TPA window. Not a candidate for IR given core of 194 ml.  Enrolled in the CHARM trial (IV glyburide versus placebo for cerebral edema).  Stroke: large left MCA infarct secondary with cytotoxic cerebral edema and petechial hemorrhage - embolic - source unknown, could be due to bilateral ICA high grade  stenosis/occlusion - enrolled in CHARM  CT head no acute hemorrhage; large area of acute ischemia in the left MCA territory with hyperdense left MCA. Aspects 3.  CTA head & neck Complete occlusion of the left middle cerebral artery with no collateralization. Diffuse narrowing of the left carotid system, with short segment occlusion of the distal left common carotid artery.70% stenosis of the right internal carotid artery at the bifurcation.   CT perfusion: Large core infarct of 194 mL in the left MCA territory.  Repeat CT head with evolving large left MCA territory infarct there which has now propagated to the left basal ganglia   MRI large evolving left MCA territory infarct with associated extensive cytotoxic edema with developing regional mass-effect. Few additional right cerebral hemisphere subcentimeter infarcts, likely embolic  2D Echo  - EF 60 - 65%. No cardiac source of emboli identified.   LDL 150  HgbA1c 6.1  UDS - positive for opiates (on MS Contin at home)  Heparin subq for VTE prophylaxis  aspirin 81 mg daily prior to admission, now on Aspirin 325 mg  Therapy recommendations:  pending  Disposition:  Discussed with daughter Earnest Bailey and she stated that pt had discussed with her in the past that he does not want trach so daughter will consider one way extubation without re-intubation likely tomorrow  Cerebral edema  Induced hypernatremia  CT repeat 12/17/2018 - significant MLS  NSG Dr. Kathyrn Sheriff consulted and s/p hemi-crani 12/12/2018  CT head repeat 12/17/18 - improved MLS, but still has shift at 11.5 mm  CT head repeat 12/19/18 - worsening MLS to 12.28mm  CT head repeat 12/22/18 - improved MLS to 10 mm  on 3% protocol for cerebral edema control  Sodium 156->158->154->157->160->157->153->151->155->161  off 3% now, status post 23.4% saline once on 12/20/2018  Na goal 150-160  Check NA q6h  Fever with pneumonia   Spiking fever overnight   Tmax 102.3  CXR  persistent left base and new right base infiltrates  Copious secretions  On zosyn   Carotid stenosis  CTA head neck - Diffuse narrowing of the left carotid system, with short segment occlusion of the distal left common carotid artery.70% stenosis of the right internal carotid artery at the bifurcation.  On ASA  Respiratory failure  Intubated for airway protection and for surgery   CCM on board  Tolerating weaning trial  Extubate as able  Daughter planning one-way extubation tomorrow  Severe leukocytosis  WBC 33.4->31.2->30.5->19.6->18.3->22.9-> 22.2->22.9-25.8->31.4  Tmax  100.9->afebrile->102.3  On zosyn  Likely reactive  Close monitoring  Hypertension  On the low end  On lisinopril 20mg  bid and amlodipine 10 . BP goal < 180  Hyperlipidemia  Home meds:  none  LDL 150, goal < 70  Was put on atorvastatin 40  ALT 16-49-131-134  AST 21-43-64-55  DC Lipitor at this time  Continue monitoring  Other Stroke Risk Factors  Cigarette smoker advised to stop smoking  Hx stroke - 2011 without residue  Other Active Problems  Hypokalemia 3.4- replace->3.4 -> supplemented->3.6-3.5-3.7->3.5 supplement  Dysphagia - on TF @ 40   This patient is critically ill and at significant risk of neurological worsening, death and care requires constant monitoring of vital signs, hemodynamics,respiratory and cardiac monitoring, extensive review of multiple databases, frequent neurological assessment, discussion with family, other specialists and medical decision making of high complexity. I spent 35 minutes of neurocritical care time in the care of  this patient. I discussed with Dr. Thera Flake, MD PhD Stroke Neurology 12/22/2018 11:31 AM   To contact Stroke Continuity provider, please refer to [email or web address] After hours, contact General Neurology

## 2018-12-22 NOTE — Progress Notes (Signed)
Pharmacy Antibiotic Note  Joshua Beck is a 53 y.o. male admitted on 11/23/2018 with stroke on D#9 of hospitalization. Now with worsening leukocytosis and concern for infiltrates on chest xray.  Pharmacy has been consulted for Zosyn dosing to cover HCAP. WBC 31.4. SCr wnl. Remains culture negative so far   Plan: -Zosyn 3.375 IV Q 8 hours  -Holding off on vancomycin today  -Monitor cultures and WBC trending   Height: 6' (182.9 cm) Weight: 150 lb 2.1 oz (68.1 kg) IBW/kg (Calculated) : 77.6  Temp (24hrs), Avg:99.7 F (37.6 C), Min:97.7 F (36.5 C), Max:102.3 F (39.1 C)  Recent Labs  Lab 12/18/18 0336 12/19/18 0359 12/20/18 0413 12/20/18 1720 12/21/18 0508 12/22/18 0529  WBC 22.9* 22.2* 22.9* 25.8*  --  31.4*  CREATININE 0.84 0.77 0.70  --  0.66 0.74    Estimated Creatinine Clearance: 104 mL/min (by C-G formula based on SCr of 0.74 mg/dL).    No Known Allergies  1/22 MRSA PCR - negative 1/26 TA - NegF 1/26 BCx - negF 1/29 TA > pending   Thank you for allowing pharmacy to be a part of this patient's care.  Albertina Parr, PharmD., BCPS Clinical Pharmacist Clinical phone for 12/22/18 until 3:30pm: [phone] If after 3:30pm, please refer to [facility] for unit-specific pharmacist

## 2018-12-22 NOTE — Progress Notes (Signed)
Physical Therapy Treatment Patient Details Name: Joshua Beck MRN: [id number] DOB: [birth year] Today's Date: 12/22/2018    History of Present Illness Pt is a 53 y.o. 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 Pt not a candidate for TPA d/t presenting outside of TPA window. Not a candidate for IR. MRI revealed L MCA acute ischemic infarct with left to right midline shift. s/p craniectomy 1/25    PT Comments    Pt on CPAP vent mode today with VSS throughout session and pt able to open eyes throughout this session. No visual tracking noted during session and no automatic movement of LUE with very limited knee flexion of LLE after moving LLE passively as only movement pt exhibited this session. Performed passive neck rotation to right with pt positioned in midline head with semichair position end of session and rN aware. PT without sedatives on board, less interactive and noted possible one way extubation next week. Will continue to follow short term for pt progression.     Follow Up Recommendations  LTACH;Supervision/Assistance - 24 hour     Equipment Recommendations  Hospital bed    Recommendations for Other Services       Precautions / Restrictions Precautions Precautions: Fall Precaution Comments: vent, cortrak, righ hemiplegai    Mobility  Bed Mobility Overal bed mobility: Needs Assistance       Supine to sit: Total assist;+2 for safety/equipment     General bed mobility comments: foot egress positioning utilized to place pt in full chair from supine to sit. PT total assist to bring trunk off of surface without significant assist for sitting balance when unsupported. Pt offering no movement of LUE or LLE today during session with command or movement of limb  Transfers                  General transfer comment: unable  Ambulation/Gait             General Gait Details: unable   Stairs             Wheelchair Mobility    Modified Rankin (Stroke Patients Only) Modified Rankin (Stroke Patients Only) Pre-Morbid Rankin Score: No symptoms Modified Rankin: Severe disability     Balance Overall balance assessment: Needs assistance   Sitting balance-Leahy Scale: Zero                                      Cognition Arousal/Alertness: Lethargic Behavior During Therapy: Flat affect Overall Cognitive Status: Difficult to assess                                 General Comments: pt with eyes open grossly 60% of session with no movement to command, no visual tracking and only response directly to stimuli of grimace when neck rotated      Exercises General Exercises - Upper Extremity Shoulder Flexion: PROM;5 reps;Left;Seated General Exercises - Lower Extremity Long Arc Quad: PROM;5 reps;Seated;Both Hip Flexion/Marching: PROM;5 reps;Seated;Both    General Comments        Pertinent Vitals/Pain Pain Assessment: (CPOT = 0)    Home Living  Prior Function            PT Goals (current goals can now be found in the care plan section) Progress towards PT goals: Not progressing toward goals - comment    Frequency    Min 2X/week      PT Plan Current plan remains appropriate    Co-evaluation              AM-PAC PT "6 Clicks" Mobility   Outcome Measure  Help needed turning from your back to your side while in a flat bed without using bedrails?: Total Help needed moving from lying on your back to sitting on the side of a flat bed without using bedrails?: Total Help needed moving to and from a bed to a chair (including a wheelchair)?: Total Help needed standing up from a chair using your arms (e.g., wheelchair or bedside chair)?: Total Help needed to walk in hospital room?:  Total Help needed climbing 3-5 steps with a railing? : Total 6 Click Score: 6    End of Session   Activity Tolerance: Patient tolerated treatment well Patient left: in bed;with call bell/phone within reach;with bed alarm set Nurse Communication: Mobility status;Need for lift equipment PT Visit Diagnosis: Other abnormalities of gait and mobility (R26.89);Muscle weakness (generalized) (M62.81);Other symptoms and signs involving the nervous system (R29.898);Hemiplegia and hemiparesis Hemiplegia - Right/Left: Right Hemiplegia - dominant/non-dominant: Dominant Hemiplegia - caused by: Cerebral infarction     Time: 0093-8182 PT Time Calculation (min) (ACUTE ONLY): 19 min  Charges:  $Therapeutic Activity: 8-22 mins                     [place], PT Acute Rehabilitation Services Pager: [phone] Office: [phone]    Ileen Kahre B Arnez Stoneking 12/22/2018, 12:59 PM

## 2018-12-22 NOTE — Progress Notes (Signed)
NAME:  Joshua Beck, MRN:  [id number], DOB:  28-Feb-1966, LOS: 32 ADMISSION DATE:  11/30/2018, CONSULTATION DATE:  12/11/2018 REFERRING MD:  Glenda Chroman, CHIEF COMPLAINT:  CVA   Brief History   53 y/o male with large L MCA stroke  Past Medical History  Oropharyngeal cancer  Significant Hospital Events   1/22 Admi 1/25 decompressive craniectomy  Consults:  PCCM Neurosurgery  Procedures:  ETT 1/25 >  PICC 1/23 >   Significant Diagnostic Tests:  MRI brain 1/23 >> large acute ischemic CVA LT MCA territory, cytotoxic edema, 3 mm Lt to Rt shift, occlusive thrombus Lt MCA CT head 1/25 >> worsening cerebral edema with 14 mm shift, subfalcine herniation of Lt cingulate gyrus CT head 1/26 >> interval left frontoparietal craniectomy for decompression, L MCA changes consistent with infarct with swelling, decreased mass effect CT head 1/29 > acute nonhemorrhagic pontine infarct vs artifact, evolving acute large left MCA territory infarct without hemorrhagic infarct.  S/p left decompressive craniectomy. [place] 1/31 > stable. CXR 1/31 > bibasilar infiltrates  Micro Data:  1/26 resp culture > neg. 1/26 blood >  1/29 sputum >   Antimicrobials:  Zosyn 1/31 >   Interim history/subjective:  No acute events. Still opens eyes to voice but only follows very basic commands intermittently at best (moves left hand only). Secretions improved; however, WBC worse and CXR with concern for bibasilar infiltrates.  Family opting for one way extubation Saturday 2/1.  Objective   Blood pressure 125/77, pulse 81, temperature 99.9 F (37.7 C), temperature source Axillary, resp. rate 16, height 6' (1.829 m), weight 68.1 kg, SpO2 99 %.    Vent Mode: PSV;CPAP FiO2 (%):  [40 %] 40 % Set Rate:  [14 bmp] 14 bmp Vt Set:  [620 mL] 620 mL PEEP:  [5 cmH20] 5 cmH20 Pressure Support:  [10 cmH20] 10 cmH20 Plateau Pressure:  [16 cmH20-18 cmH20] 18 cmH20   Intake/Output Summary (Last 24 hours) at 12/22/2018  0926 Last data filed at 12/22/2018 0700 Gross per 24 hour  Intake 2402.55 ml  Output 2375 ml  Net 27.55 ml   Filed Weights   12/20/18 0412 12/21/18 0400 12/22/18 0500  Weight: 68.4 kg 67.2 kg 68.1 kg   Examination:  General:  Adult male, in NAD HENT: Surgical staples in place. ETT in place PULM: Respirations even.  Secretions improved.  Coarse in bases L > R CV: RRR, no mgr GI: BS+, soft, non-tender, skull flap in RLQ MSK: normal bulk and tone Neuro: Opens eyes to voice, follows some basic commands   Assessment & Plan:   Acute respiratory failure with hypoxemia - improved 1/28 and 1/29. - Continue vent support. - One way extubation being planned for Saturday 2/1 once all family arrives (confirmed this with daughter Earnest Bailey over the phone 1/30). - VAP prevention. - Follow CXR.  Probable HCAP - MRSA PCR neg. - Start Zosyn. - Follow cultures through completion.  L MCA ischemic stroke with cerebral edema, c/p decompressive craniotomy; however, stroke continues to evolve. - Hypertonic saline d/c'd 1/31. - Neurology managing.  Persistent acute encephalopathy 1/28: presumably related to stroke. - Continue supportive care.  Hyperglycemia. - Continue SSI.  Leukocytosis - likely reactive, stable over past few days.  Had thick secretions but no fevers and CXR not convincing for infxn. - Continue to monitor clinically.  Best practice:  Diet: tube feeding Pain/Anxiety/Delirium protocol (if indicated): RASS goal 0 VAP protocol (if indicated): yes DVT prophylaxis: SCD GI prophylaxis: Pantoprazole Glucose control: SSI Mobility: bed  rest Code Status: partial code, no CPR Family Communication: none bedside.  One way extubation being planned for Saturday 2/1 once all family arrives (confirmed this with daughter Earnest Bailey over the phone 1/30). Disposition: remain in ICU   Critical care time: 30 min.    Montey Hora, [facility] Pager: [phone].  If no answer, [phone] 12/22/2018, 9:26 AM

## 2018-12-23 ENCOUNTER — Inpatient Hospital Stay (HOSPITAL_COMMUNITY): Payer: Medicare Other

## 2018-12-23 DIAGNOSIS — Z9911 Dependence on respirator [ventilator] status: Secondary | ICD-10-CM

## 2018-12-23 DIAGNOSIS — R509 Fever, unspecified: Secondary | ICD-10-CM

## 2018-12-23 DIAGNOSIS — D72829 Elevated white blood cell count, unspecified: Secondary | ICD-10-CM

## 2018-12-23 LAB — BASIC METABOLIC PANEL
Anion gap: 9 (ref 5–15)
BUN: 28 mg/dL — ABNORMAL HIGH (ref 6–20)
CO2: 26 mmol/L (ref 22–32)
CREATININE: 0.87 mg/dL (ref 0.61–1.24)
Calcium: 8.2 mg/dL — ABNORMAL LOW (ref 8.9–10.3)
Chloride: 123 mmol/L — ABNORMAL HIGH (ref 98–111)
GFR calc non Af Amer: >60 mL/min (ref >60)
Glucose, Bld: 136 mg/dL — ABNORMAL HIGH (ref 70–99)
Potassium: 3.4 mmol/L — ABNORMAL LOW (ref 3.5–5.1)
Sodium: 158 mmol/L — ABNORMAL HIGH (ref 135–145)

## 2018-12-23 LAB — PHOSPHORUS: Phosphorus: 3.7 mg/dL (ref 2.5–4.6)

## 2018-12-23 LAB — GLUCOSE, CAPILLARY
Glucose-Capillary: 128 mg/dL — ABNORMAL HIGH (ref 70–99)
Glucose-Capillary: 138 mg/dL — ABNORMAL HIGH (ref 70–99)
Glucose-Capillary: 117 mg/dL — ABNORMAL HIGH (ref 70–99)
Glucose-Capillary: 136 mg/dL — ABNORMAL HIGH (ref 70–99)

## 2018-12-23 LAB — SODIUM: Sodium: 151 mmol/L — ABNORMAL HIGH (ref 135–145)

## 2018-12-23 LAB — MAGNESIUM: Magnesium: 2.4 mg/dL (ref 1.7–2.4)

## 2018-12-23 MED ORDER — DIPHENHYDRAMINE HCL 50 MG/ML IJ SOLN: 25.0000 mg | INTRAMUSCULAR | Status: DC | PRN

## 2018-12-23 MED ORDER — GLYCOPYRROLATE 0.2 MG/ML IJ SOLN: 0.2000 mg | INTRAMUSCULAR | Status: DC | PRN

## 2018-12-23 MED ORDER — MORPHINE BOLUS VIA INFUSION
5.0000 mg | INTRAVENOUS | Status: DC | PRN
  Filled 2018-12-23: qty 5

## 2018-12-23 MED ORDER — ACETAMINOPHEN 325 MG PO TABS: 650.0000 mg | ORAL_TABLET | Freq: Four times a day (QID) | ORAL | Status: DC | PRN

## 2018-12-23 MED ORDER — GLYCOPYRROLATE 1 MG PO TABS
1.0000 mg | ORAL_TABLET | ORAL | Status: DC | PRN
  Filled 2018-12-23: qty 1

## 2018-12-23 MED ORDER — MORPHINE 100MG IN NS 100ML (1MG/ML) PREMIX INFUSION
0.0000 mg/h | INTRAVENOUS | Status: DC
  Administered 2018-12-23: 5 mg/h via INTRAVENOUS
  Administered 2018-12-24: 10 mg/h via INTRAVENOUS
  Filled 2018-12-23 (×2): qty 100

## 2018-12-23 MED ORDER — MORPHINE SULFATE (PF) 4 MG/ML IV SOLN
INTRAVENOUS | Status: AC
  Administered 2018-12-23: 4 mg
  Filled 2018-12-23: qty 1

## 2018-12-23 MED ORDER — POLYVINYL ALCOHOL 1.4 % OP SOLN
1.0000 [drp] | Freq: Four times a day (QID) | OPHTHALMIC | Status: DC | PRN
  Filled 2018-12-23: qty 15

## 2018-12-23 MED ORDER — DEXTROSE 5 % IV SOLN: INTRAVENOUS | Status: DC

## 2018-12-23 MED ORDER — LORAZEPAM 2 MG/ML IJ SOLN: 2.0000 mg | INTRAMUSCULAR | Status: DC | PRN

## 2018-12-23 MED ORDER — LORAZEPAM 2 MG/ML IJ SOLN
INTRAMUSCULAR | Status: AC
  Filled 2018-12-23: qty 1

## 2018-12-23 MED ORDER — ACETAMINOPHEN 650 MG RE SUPP: 650.0000 mg | Freq: Four times a day (QID) | RECTAL | Status: DC | PRN

## 2018-12-23 MED ORDER — GLYCOPYRROLATE 0.2 MG/ML IJ SOLN
0.2000 mg | INTRAMUSCULAR | Status: DC | PRN
  Administered 2018-12-23: 0.2 mg via INTRAVENOUS
  Filled 2018-12-23: qty 1

## 2018-12-23 MED ORDER — MORPHINE SULFATE (PF) 2 MG/ML IV SOLN
2.0000 mg | INTRAVENOUS | Status: DC | PRN
  Administered 2018-12-23 – 2018-12-24 (×2): 2 mg via INTRAVENOUS
  Filled 2018-12-23 (×2): qty 1

## 2018-12-23 NOTE — Progress Notes (Signed)
Patient's family met with Dr. Valeta Harms. One way extubation and CMO.  -- Amie Portland, MD [facility] Pager: [phone] If 7pm to 7am, please call on call as listed on AMION.

## 2018-12-23 NOTE — Progress Notes (Signed)
RT NOTE: RT one-way extubated patient with RN at bedside per MD order and family wishes.

## 2018-12-23 NOTE — Progress Notes (Signed)
Chaplain responded to call from nurse; patient was desaturating. Prognosis grim. Family was on way.  One daughter was crying at bedside, waiting for others when Chaplain arrived. Daughter Steva Ready was using phone to [facility] with sister in another state. Chaplain learned that the children's mother had died just six months ago.  Chaplain offered prayer.  One hour later, chaplain returned to meet many other siblings and others present.  Family and friends were bedside singing. Two others outside were asking the meaning of the medical screen readings. Chaplain offered presence and closed door so that other patients would not be disturbed. Patient currently not desaturating as he was.  Will continue to be available. Tamsen Snider Pager [phone]

## 2018-12-23 NOTE — Progress Notes (Signed)
CHARM Trial NIHSS 1a Level of Conscious.: 0 1b LOC Questions: 2 1c LOC Commands:0  2 Best Gaze: 1 3 Visual: 2 4 Facial Palsy:1  5a Motor Arm - left:0  5b Motor Arm - Right:4 6a Motor Leg - Left: 0 6b Motor Leg - Right:2 7 Limb Ataxia: 0 8 Sensory: 1 9 Best Language: 2 10 Dysarthria: UN (Intubated) 11 Extinct. and Inatten.: 2  TOTAL: 17 (UN)  -- Amie Portland, MD [facility] Pager: [phone] If 7pm to 7am, please call on call as listed on AMION.

## 2018-12-23 NOTE — Progress Notes (Signed)
NAME:  Joshua Beck, MRN:  [id number], DOB:  Dec 24, 1965, LOS: 89 ADMISSION DATE:  11/28/2018, CONSULTATION DATE:  12/09/2018 REFERRING MD:  Glenda Chroman, CHIEF COMPLAINT:  CVA   Brief History   53 y/o male with large L MCA stroke  Past Medical History  Oropharyngeal cancer  Significant Hospital Events   1/22 Admi 1/25 decompressive craniectomy  Consults:  PCCM Neurosurgery  Procedures:  ETT 1/25 >  PICC 1/23 >   Significant Diagnostic Tests:  MRI brain 1/23 >> large acute ischemic CVA LT MCA territory, cytotoxic edema, 3 mm Lt to Rt shift, occlusive thrombus Lt MCA CT head 1/25 >> worsening cerebral edema with 14 mm shift, subfalcine herniation of Lt cingulate gyrus CT head 1/26 >> interval left frontoparietal craniectomy for decompression, L MCA changes consistent with infarct with swelling, decreased mass effect CT head 1/29 > acute nonhemorrhagic pontine infarct vs artifact, evolving acute large left MCA territory infarct without hemorrhagic infarct.  S/p left decompressive craniectomy. Morse 1/31 > stable. CXR 1/31 > bibasilar infiltrates  Micro Data:  1/26 resp culture > neg. 1/26 blood >  1/29 sputum >   Antimicrobials:  Zosyn 1/31 >   Interim history/subjective:  Low-grade temperatures overnight, 100.4 this morning.  Started on Zosyn yesterday.  Some secretions from endotracheal tube.  No infiltrate on chest x-ray done this morning.  Objective   Blood pressure 121/79, pulse 88, temperature (!) 100.4 F (38 C), temperature source Oral, resp. rate (!) 24, height 6' (1.829 m), weight 68.1 kg, SpO2 99 %.    Vent Mode: CPAP;PSV FiO2 (%):  [40 %] 40 % Set Rate:  [14 bmp] 14 bmp Vt Set:  [620 mL] 620 mL PEEP:  [5 cmH20] 5 cmH20 Pressure Support:  [10 cmH20] 10 cmH20 Plateau Pressure:  [16 cmH20-20 cmH20] 20 cmH20   Intake/Output Summary (Last 24 hours) at 12/23/2018 1036 Last data filed at 12/23/2018 8921 Gross per 24 hour  Intake 1588.57 ml  Output 1550 ml  Net  38.57 ml   Filed Weights   12/20/18 0412 12/21/18 0400 12/22/18 0500  Weight: 68.4 kg 67.2 kg 68.1 kg   Examination:  General appearance: 53 y.o., male, intubated on mechanical ventilation Eyes: anicteric sclerae, he will attempt to track and look at you to voice command, left gaze, does not blink to threat HENT: NCAT Neck: Trachea midline; endotracheal tube in place Lungs: Bilateral ventilated breath sounds, no crackles, no wheeze CV: Regular rate and rhythm, S1-S2, no MRG Abdomen: Soft, non-tender; non-distended, BS present  Extremities: No peripheral edema, radial and DP pulses present bilaterally  Skin: Normal temperature, turgor and texture; no rash Neuro: Opens eyes to voice, follows basic commands at times per nursing.  Was not following any commands for me at this time.  Assessment & Plan:   Acute hypoxemic respiratory failure requiring intubation and mechanical ventilation Acute respiratory failure with hypoxemia - improved 1/28 and 1/29. Continue current vent support at this time. Per prior documentation family plans one-way extubation.  Potentially for today. Once family available nursing staff to contact me. We will not plan for any one-way extubation until this is discussed with the family.  I do not suspect that he will be able to maintain on his own very long. This was discussed with neurology this morning.  They are in agreement.  Possible HCAP/tracheitis- MRSA PCR neg. Secretions from ET tube Continue Zosyn Follow cultures  L MCA ischemic stroke with cerebral edema, c/p decompressive craniotomy; however, stroke continues to evolve.  Appreciate neurology recommendations, nothing further  Persistent acute encephalopathy 1/28: presumably related to stroke. No intervention  Hyperglycemia. Continue SSI  Past history of oropharyngeal cancer status post chemoradiation in remission However this piece of information is very important due to liberation from mechanical  ventilator. This would place him at high risk for a difficult airway.  Best practice:  Diet: tube feeding Pain/Anxiety/Delirium protocol (if indicated): RASS goal 0 VAP protocol (if indicated): yes DVT prophylaxis: SCD GI prophylaxis: Pantoprazole Glucose control: SSI Mobility: bed rest Code Status: partial code, no CPR Family Communication: none bedside.  One way extubation being planned for Saturday 2/1 once all family arrives (confirmed this with daughter Earnest Bailey over the phone 1/30). Disposition: remain in ICU  This patient is critically ill with multiple organ system failure; which, requires frequent high complexity decision making, assessment, support, evaluation, and titration of therapies. This was completed through the application of advanced monitoring technologies and extensive interpretation of multiple databases. During this encounter critical care time was devoted to patient care services described in this note for 35 minutes.   Garner Nash, DO [facility] 12/23/2018 10:36 AM  Personal pager: [phone] If unanswered, please page CCM On-call: [phone]

## 2018-12-23 NOTE — Progress Notes (Signed)
Patient appears to be resting comfortably with family at the bedside.

## 2018-12-23 NOTE — Progress Notes (Signed)
PCCM:  Called to the bedside for family meeting.  I spoke with 5 children present at bedside along with their significant others and the patient's pastor.  The decision was made to transition the patient to comfort measures.  They would prefer to have the patient removed from mechanical ventilation and to be made comfortable.  I have placed the critical care withdrawal of sustaining life measures order set for comfort measures.  This was discussed with the patient's family who are all in agreement of this plan.  I have notified neurology.  [facility] 12/23/2018 5:59 PM

## 2018-12-23 NOTE — Progress Notes (Signed)
STROKE TEAM PROGRESS NOTE   INTERVAL HISTORY No family at bedside. Pt open eyes on voice, able to maintain eye opening, but not following commands with me. As per RN, he is weaning. Per RN, family declines trach and he will be extubated, unclear when, discussed with ICU attending and will await family at the bedside. No changes in neuro exam today, stable. No events overnight.   Vitals:   12/23/18 0700 12/23/18 0800 12/23/18 0839 12/23/18 0840  BP: 121/82  121/79   Pulse: 87  88   Resp: 16  (!) 24   Temp:  99.8 F (37.7 C)    TempSrc:  Axillary    SpO2: 98%  99% 99%  Weight:      Height:        CBC:  Recent Labs  Lab 12/17/18 1033  12/20/18 1720 12/22/18 0529  WBC 18.3*   < > 25.8* 31.4*  NEUTROABS 15.7*  --  20.9*  --   HGB 10.3*   < > 12.4* 11.7*  HCT 33.9*   < > 39.5 37.8*  MCV 104.3*   < > 102.3* 106.8*  PLT 249   < > 319 334   < > = values in this interval not displayed.    Basic Metabolic Panel:  Recent Labs  Lab 12/22/18 0529  12/22/18 2244 12/23/18 0421  NA 161*   < > 159* 158*  K 3.5  --   --  3.4*  CL 128*  --   --  123*  CO2 27  --   --  26  GLUCOSE 129*  --   --  136*  BUN 21*  --   --  28*  CREATININE 0.74  --   --  0.87  CALCIUM 8.2*  --   --  8.2*  MG 2.4  --   --  2.4  PHOS 3.1  --   --  3.7   < > = values in this interval not displayed.   Lipid Panel:     Component Value Date/Time   CHOL 212 (H) 12/14/2018 0004   TRIG 170 (H) 12/19/2018 1628   HDL 50 12/14/2018 0004   CHOLHDL 4.2 12/14/2018 0004   VLDL 12 12/14/2018 0004   LDLCALC 150 (H) 12/14/2018 0004   HgbA1c:  Lab Results  Component Value Date   HGBA1C 6.1 (H) 12/14/2018   Urine Drug Screen:     Component Value Date/Time   LABOPIA POSITIVE (A) 12/17/2018 1156   COCAINSCRNUR NONE DETECTED 12/17/2018 1156   LABBENZ NONE DETECTED 12/17/2018 1156   AMPHETMU NONE DETECTED 12/17/2018 1156   THCU NONE DETECTED 12/17/2018 1156   LABBARB NONE DETECTED 12/17/2018 1156    Alcohol  Level No results found for: ETH  IMAGING  Ct Head Wo Contrast 12/02/2018 IMPRESSION:  1. Worsening cerebral edema with rightward midline shift now measuring 14 mm.  2. Subfalcine herniation of the left cingulate gyrus.  3. No acute hemorrhage.   Mr Brain Wo Contrast 12/14/2018 IMPRESSION:  1. Large confluent evolving acute ischemic left MCA territory infarct. Associated extensive cytotoxic edema with developing regional mass effect and new 3 mm left-to-right midline shift. No hydrocephalus or ventricular trapping. Associated scattered petechial hemorrhage within the area of infarction without frank hemorrhagic transformation.  2. Evidence for occlusive thrombus throughout the left middle cerebral artery, stable from prior CTA. Abnormal flow void within the left ICA to the cavernous segment may reflect slow flow and/or occlusion.  3. Few additional subcentimeter  ischemic infarcts within the right cerebral hemisphere as above, likely embolic.   Transthoracic Echocardiogram  12/14/2018 Study Conclusions - Left ventricle: The cavity size was normal. Systolic function was   normal. The estimated ejection fraction was in the range of 60%   to 65%. Wall motion was normal; there were no regional wall   motion abnormalities. Left ventricular diastolic function   parameters were normal. - Atrial septum: No defect or patent foramen ovale was identified. Impressions: - Normal study. No cardiac source of emboli was indentified.  Ct Head Wo Contrast  Result Date: 12/17/2018 CLINICAL DATA:  Left hemisphere stroke. Craniotomy for decompression. EXAM: CT HEAD WITHOUT CONTRAST TECHNIQUE: Contiguous axial images were obtained from the base of the skull through the vertex without intravenous contrast. COMPARISON:  11/27/2018 FINDINGS: Brain: Interval left frontoparietal craniectomy for decompression. Extreme low-density in the complete left MCA territory consistent with completed infarction with swelling.  No sign of hemorrhage. Subsequent to the craniectomy, there is less mass effect with left-to-right shift of 11.5 mm on this study compared with 15.5 mm on the previous scan. No ventricular trapping. No extra-axial collection. No new infarction. Vascular: No acute vascular finding. Hyperdense left MCA as seen previously. Skull: Left frontoparietal craniectomy as noted above. Sinuses/Orbits: Some inflammatory changes of the maxillary sinuses as noted previously. Orbits negative. Other: None IMPRESSION: Interval left frontoparietal craniectomy for decompression. Extreme low-density in the complete left MCA territory consistent with completed infarction with swelling. Decreased mass effect with left-to-right shift of 11.5 mm on this study compared with 15.5 mm on the previous scan. Electronically Signed   By: Nelson Chimes M.D.   On: 12/17/2018 17:41   Ct Head Wo Contrast  Result Date: 12/17/2018 CLINICAL DATA:  Left hemisphere stroke. Craniotomy for decompression. EXAM: CT HEAD WITHOUT CONTRAST TECHNIQUE: Contiguous axial images were obtained from the base of the skull through the vertex without intravenous contrast. COMPARISON:  12/15/2018 FINDINGS: Brain: Interval left frontoparietal craniectomy for decompression. Extreme low-density in the complete left MCA territory consistent with completed infarction with swelling. No sign of hemorrhage. Subsequent to the craniectomy, there is less mass effect with left-to-right shift of 11.5 mm on this study compared with 15.5 mm on the previous scan. No ventricular trapping. No extra-axial collection. No new infarction. Vascular: No acute vascular finding. Hyperdense left MCA as seen previously. Skull: Left frontoparietal craniectomy as noted above. Sinuses/Orbits: Some inflammatory changes of the maxillary sinuses as noted previously. Orbits negative. Other: None IMPRESSION: Interval left frontoparietal craniectomy for decompression. Extreme low-density in the complete left  MCA territory consistent with completed infarction with swelling. Decreased mass effect with left-to-right shift of 11.5 mm on this study compared with 15.5 mm on the previous scan. Electronically Signed   By: Nelson Chimes M.D.   On: 12/17/2018 17:41    PHYSICAL EXAM  Temp:  [99.8 F (37.7 C)-100.9 F (38.3 C)] 99.8 F (37.7 C) (02/01 0800) Pulse Rate:  [84-98] 88 (02/01 0839) Resp:  [16-29] 24 (02/01 0839) BP: (101-143)/(61-85) 121/79 (02/01 0839) SpO2:  [95 %-99 %] 99 % (02/01 0840) FiO2 (%):  [40 %] 40 % (02/01 0840)  General - Well nourished, well developed, intubated off sedation.  Ophthalmologic - fundi not visualized due to noncooperation.  Cardiovascular - Regular rate and rhythm.  Neuro - intubated off sedation, eyes open on voice, but not following commands. Eyes in left gaze position, doll's eye present, blinking to be described on the left, not blinking to visual threat to the right,  not tracking, PERRL. Corneal reflex present on the left but not on the right, gag and cough present. Breathing over the vent.  Facial symmetry not able to test due to ET tube.  Tongue midline in mouth. On pain stimulation, withdraw in left upper and lower extremities against gravity, 1/5 in right upper extremity but 2-/5 left lower extremity on pain stimulation. DTR 1+ and babinski positive on the right. Sensation, coordination and gait not tested.   ASSESSMENT/PLAN Mr. RAUDEL BAZEN is a 53 y.o. 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 Not a candidate for TPA d/t presenting outside of TPA window. Not a candidate for IR given core of 194 ml.  Enrolled in the CHARM trial (IV glyburide versus placebo for cerebral edema).  Stroke: large left MCA infarct secondary with cytotoxic cerebral edema and petechial  hemorrhage - embolic - source unknown, could be due to bilateral ICA high grade stenosis/occlusion - enrolled in CHARM  CT head no acute hemorrhage; large area of acute ischemia in the left MCA territory with hyperdense left MCA. Aspects 3.  CTA head & neck Complete occlusion of the left middle cerebral artery with no collateralization. Diffuse narrowing of the left carotid system, with short segment occlusion of the distal left common carotid artery.70% stenosis of the right internal carotid artery at the bifurcation.   CT perfusion: Large core infarct of 194 mL in the left MCA territory.  Repeat CT head with evolving large left MCA territory infarct there which has now propagated to the left basal ganglia   MRI large evolving left MCA territory infarct with associated extensive cytotoxic edema with developing regional mass-effect. Few additional right cerebral hemisphere subcentimeter infarcts, likely embolic  2D Echo  - EF 60 - 65%. No cardiac source of emboli identified.   LDL 150  HgbA1c 6.1  UDS - positive for opiates (on MS Contin at home)  Heparin subq for VTE prophylaxis  aspirin 81 mg daily prior to admission, now on Aspirin 325 mg  Therapy recommendations:  LTACH  Disposition:  Dr. Erlinda Hong discussed with daughter Earnest Bailey and she stated that pt had discussed with her in the past that he does not want trach so daughter will consider one way extubation without re-intubation likely over the weekend  Cerebral edema  Induced hypernatremia  CT repeat 12/18/2018 - significant MLS  NSG Dr. Kathyrn Sheriff consulted and s/p hemi-crani 12/19/2018  CT head repeat 12/17/18 - improved MLS, but still has shift at 11.5 mm  CT head repeat 12/19/18 - worsening MLS to 12.5mm  CT head repeat 12/22/18 - improved MLS to 10 mm  on 3% protocol for cerebral edema control  Sodium 156->158->154->157->160->157->153->151->155->161  off 3% now, status post 23.4% saline once on 12/20/2018  Na goal  150-160  Check NA q6h  Fever with pneumonia   Spiking fever overnight   Tmax 102.3 -> 99.8  CXR persistent left base and new right base infiltrates  Copious secretions  On zosyn   Carotid stenosis  CTA head neck - Diffuse narrowing of the left carotid system, with short segment occlusion of the distal left common carotid artery.70% stenosis of the right internal carotid artery at the bifurcation.  On ASA  Respiratory failure  Intubated for airway protection and for surgery   CCM on board  Tolerating weaning trial  Extubate as able  Daughter planning one-way extubation over the weekend  Severe leukocytosis  WBC 33.4->31.2->30.5->19.6->18.3->22.9-> 22.2->22.9-25.8->31.4  Tmax 100.9->afebrile->102.3  On zosyn  Likely reactive  Close monitoring  Hypertension  On the low end  On lisinopril 20mg  bid and amlodipine 10 . BP goal < 180  Hyperlipidemia  Home meds:  none  LDL 150, goal < 70  Was put on atorvastatin 40  ALT 16-49-131-134  AST 21-43-64-55  DC Lipitor at this time  Continue monitoring  Other Stroke Risk Factors  Cigarette smoker advised to stop smoking  Hx stroke - 2011 without residue  Other Active Problems  Hypokalemia 3.4- replace->3.4 -> supplemented->3.6-3.5-3.7->3.5 supplement->3.4 - will hold supplement for now if extubation is planned.  Dysphagia - on TF @ 40   This patient is critically ill and at significant risk of neurological worsening, death and care requires constant monitoring of vital signs, hemodynamics,respiratory and cardiac monitoring,review of multiple databases, neurological assessment, discussion with family, other specialists and medical decision making of high complexity.I  I spent 30 minutes of neurocritical care time in the care of this patient.  Sarina Ill, MD [facility]     To contact Stroke Continuity provider, please refer to [email or web address] After hours, contact General  Neurology

## 2018-12-23 DEATH — deceased

## 2019-01-21 NOTE — Death Summary Note (Signed)
Patient ID: Joshua Beck   MRN: [id number]      DOB: 08/30/1966  Date of Admission: 12/06/2018 Date of Discharge: 01-07-19  Attending Physician:  Rosalin Hawking, MD, Stroke MD Consultant(s):    pulmonary/intensive care Dr Halford Chessman Patient's PCP:  No primary care provider on file.  DISCHARGE DIAGNOSIS: Deceased  Active Problems:   Acute ischemic left MCA stroke ([place])   Endotracheal tube present   Respiratory failure [facility])   Past Medical History:  Diagnosis Date  . Lung cancer [facility])    Past Surgical History:  Procedure Laterality Date  . CRANIOTOMY Left 12/05/2018   Procedure: HEMICRANIECTOMY with placement of skull flap in abdomen;  Surgeon: Consuella Lose, MD;  Location: [facility];  Service: Neurosurgery;  Laterality: Left;      HOME MEDICATIONS PRIOR TO ADMISSION Medications Prior to Admission  Medication Sig Dispense Refill  . aspirin EC 81 MG tablet Take 81 mg by mouth 3 (three) times a week.    . gabapentin (NEURONTIN) 300 MG capsule Take 300 mg by mouth 4 (four) times daily as needed (for nerve pain).    . hydrochlorothiazide (MICROZIDE) 12.5 MG capsule Take 12.5 mg by mouth daily.    Marland Kitchen HYDROcodone-acetaminophen (NORCO) 10-325 MG tablet Take 1 tablet by mouth daily as needed (for pain).    Marland Kitchen lisinopril (PRINIVIL,ZESTRIL) 10 MG tablet Take 10 mg by mouth daily.    Marland Kitchen morphine (MS CONTIN) 30 MG 12 hr tablet Take 30 mg by mouth at bedtime as needed for pain.    . multivitamin (ONE-A-DAY MEN'S) TABS tablet Take 1 tablet by mouth daily.    . traZODone (DESYREL) 50 MG tablet Take 50 mg by mouth at bedtime as needed for sleep.         LABORATORY STUDIES CBC    Component Value Date/Time   WBC 31.4 (H) 12/22/2018 0529   RBC 3.54 (L) 12/22/2018 0529   HGB 11.7 (L) 12/22/2018 0529   HCT 37.8 (L) 12/22/2018 0529   PLT 334 12/22/2018 0529   MCV 106.8 (H) 12/22/2018 0529   MCH 33.1 12/22/2018 0529   MCHC 31.0 12/22/2018 0529   RDW 13.5 12/22/2018 0529   LYMPHSABS 2.1  12/20/2018 1720   MONOABS 1.6 (H) 12/20/2018 1720   EOSABS 0.1 12/20/2018 1720   BASOSABS 0.1 12/20/2018 1720   CMP    Component Value Date/Time   NA 151 (H) 12/23/2018 1300   K 3.4 (L) 12/23/2018 0421   CL 123 (H) 12/23/2018 0421   CO2 26 12/23/2018 0421   GLUCOSE 136 (H) 12/23/2018 0421   BUN 28 (H) 12/23/2018 0421   CREATININE 0.87 12/23/2018 0421   CALCIUM 8.2 (L) 12/23/2018 0421   PROT 5.6 (L) 12/22/2018 0529   ALBUMIN 2.0 (L) 12/22/2018 0529   AST 55 (H) 12/22/2018 0529   ALT 134 (H) 12/22/2018 0529   ALKPHOS 82 12/22/2018 0529   BILITOT 0.3 12/22/2018 0529   GFRNONAA >60 12/23/2018 0421   GFRAA >60 12/23/2018 0421   COAGS Lab Results  Component Value Date   INR 0.95 11/25/2018   Lipid Panel    Component Value Date/Time   CHOL 212 (H) 12/14/2018 0004   TRIG 170 (H) 12/19/2018 1628   HDL 50 12/14/2018 0004   CHOLHDL 4.2 12/14/2018 0004   VLDL 12 12/14/2018 0004   LDLCALC 150 (H) 12/14/2018 0004   HgbA1C  Lab Results  Component Value Date   HGBA1C 6.1 (H) 12/14/2018   Urinalysis No  results found for: COLORURINE, APPEARANCEUR, LABSPEC, PHURINE, GLUCOSEU, HGBUR, BILIRUBINUR, KETONESUR, PROTEINUR, UROBILINOGEN, NITRITE, LEUKOCYTESUR Urine Drug Screen     Component Value Date/Time   LABOPIA POSITIVE (A) 12/17/2018 1156   COCAINSCRNUR NONE DETECTED 12/17/2018 1156   LABBENZ NONE DETECTED 12/17/2018 1156   AMPHETMU NONE DETECTED 12/17/2018 1156   THCU NONE DETECTED 12/17/2018 1156   LABBARB NONE DETECTED 12/17/2018 1156    Alcohol Level No results found for: [facility]   SIGNIFICANT DIAGNOSTIC STUDIES  Ct Head Wo Contrast 11/25/2018 IMPRESSION:  1. Worsening cerebral edema with rightward midline shift now measuring 14 mm.  2. Subfalcine herniation of the left cingulate gyrus.  3. No acute hemorrhage.   Mr Brain Wo Contrast 12/14/2018 IMPRESSION:  1. Large confluent evolving acute ischemic left MCA territory infarct. Associated extensive cytotoxic edema  with developing regional mass effect and new 3 mm left-to-right midline shift. No hydrocephalus or ventricular trapping. Associated scattered petechial hemorrhage within the area of infarction without frank hemorrhagic transformation.  2. Evidence for occlusive thrombus throughout the left middle cerebral artery, stable from prior CTA. Abnormal flow void within the left ICA to the cavernous segment may reflect slow flow and/or occlusion.  3. Few additional subcentimeter ischemic infarcts within the right cerebral hemisphere as above, likely embolic.    Ct Head Wo Contrast 12/17/2018 IMPRESSION:  Interval left frontoparietal craniectomy for decompression. Extreme low-density in the complete left MCA territory consistent with completed infarction with swelling. Decreased mass effect with left-to-right shift of 11.5 mm on this study compared with 15.5 mm on the previous scan.    Ct Head Wo Contrast 12/17/2018 IMPRESSION:  Interval left frontoparietal craniectomy for decompression. Extreme low-density in the complete left MCA territory consistent with completed infarction with swelling. Decreased mass effect with left-to-right shift of 11.5 mm on this study compared with 15.5 mm on the previous scan.    Transthoracic Echocardiogram  12/14/2018 Study Conclusions - Left ventricle: The cavity size was normal. Systolic function was normal. The estimated ejection fraction was in the range of 60% to 65%. Wall motion was normal; there were no regional wall motion abnormalities. Left ventricular diastolic function parameters were normal. - Atrial septum: No defect or patent foramen ovale was identified. Impressions: - Normal study. No cardiac source of emboli was indentified.      HISTORY OF PRESENT ILLNESS (From Dr Aroor's H&P on 1/22/202) Joshua Beck is an 53 y.o. 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  Last known normal was 4 PM this afternoon seen by daughter.  The patient lives with his daughter and was talking normally.  The daughter went to the grocery store and when she came back she noticed the patient was not speaking and called EMS.  On assessment patient was nonverbal, a gaze deviation and weak on the right side. Blood pressure was 932 systolic.    ED course:  On arrival to [facility] emergency room patient underwent a stat CT head which already showed a large evolving left MCA infarction.  CT aspects was a 3.  Patient not a candidate for TPA is outside the 4.5-hour window.  Not a candidate for intervention due to already large infarct.  Date last known well: 1.22.20 Time last known well: 4pm tPA Given: no,outside window  NIHSS: 24 Baseline MRS 0-1    HOSPITAL COURSE Mr.  Joshua Beck is a 53 y.o. 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 Not a candidate for TPA d/t presenting outside of TPA window. Not a candidate for IR given core of 194 ml.  Enrolled in the CHARM trial (IV glyburide versus placebo for cerebral edema).  Stroke: large left MCA infarct secondary with cytotoxic cerebral edema and petechial hemorrhage - embolic - source unknown, could be due to bilateral ICA high grade stenosis/occlusion - enrolled in CHARM  CT head no acute hemorrhage; large area of acute ischemia in the left MCA territory with hyperdense left MCA. Aspects 3.  CTA head & neck Complete occlusion of the left middle cerebral artery with no collateralization. Diffuse narrowing of the left carotid system, with short segment occlusion of the distal left common carotid artery.70% stenosis of the right  internal carotid artery at the bifurcation.   CT perfusion: Large core infarct of 194 mL in the left MCA territory.  Repeat CT head with evolving large left MCA territory infarct there which has now propagated to the left basal ganglia   MRI large evolving left MCA territory infarct with associated extensive cytotoxic edema with developing regional mass-effect. Few additional right cerebral hemisphere subcentimeter infarcts, likely embolic  2D Echo  - EF 60 - 65%. No cardiac source of emboli identified.   LDL 150  HgbA1c 6.1  UDS - positive for opiates (on MS Contin at home)  Heparin subq for VTE prophylaxis  aspirin 81 mg daily prior to admission, now on Aspirin 325 mg  Therapy recommendations:  LTACH   Cerebral edema  Induced hypernatremia  CT repeat 12/11/2018 - significant MLS  NSG Dr. Kathyrn Sheriff consulted and s/p hemi-crani 12/14/2018  CT head repeat 12/17/18 - improved MLS, but still has shift at 11.5 mm  CT head repeat 12/19/18 - worsening MLS to 12.100mm  CT head repeat 12/22/18 - improved MLS to 10 mm  on 3% protocol for cerebral edema control  Sodium 156->158->154->157->160->157->153->151->155->161  off 3% now, status post 23.4% saline once on 12/20/2018  Na goal 150-160  Check NA q6h  Fever with pneumonia   Spiking fever overnight   Tmax 102.3 -> 99.8  CXR persistent left base and new right base infiltrates  Copious secretions  On zosyn   Carotid stenosis  CTA head neck - Diffuse narrowing of the left carotid system, with short segment occlusion of the distal left common carotid artery.70% stenosis of the right internal carotid artery at the bifurcation.  On ASA  Respiratory failure  Intubated for airway protection and for surgery   CCM on board  Tolerating weaning trial  Extubate as able   Severe leukocytosis  WBC 33.4->31.2->30.5->19.6->18.3->22.9-> 22.2->22.9-25.8->31.4  Tmax 100.9->afebrile->102.3  On zosyn  Likely  reactive  Close monitoring  Hypertension  On the low end  On lisinopril 20mg  bid and amlodipine 10  BP goal < 180  Hyperlipidemia  Home meds:  none  LDL 150, goal < 70  Was put on atorvastatin 40  ALT 16-49-131-134  AST 21-43-64-55  DC Lipitor at this time  Continue monitoring  Other Stroke Risk Factors  Cigarette smoker advised to stop smoking  Hx stroke - 2011 without residue  Other Active Problems  Dysphagia - on TF @ 40  Disposition:  Dr. Erlinda Hong discussed the patient's prognosis with the pt's  daughter Earnest Bailey. She stated that the pt had spoken with her in the past to the effect that he would not want a tracheostomy. Based on this information and almost no hope for any meaningful recovery the patient's daughter said she would consider one way extubation without re-intubation over the weekend. The patient's family also spoke with Dr Valeta Harms regarding one way extubation. A decision was made by the family to proceed and the patient was extubated on 12/23/2018 at 6:21 PM. The patient expired at 1345 on Jan 04, 2019 with church members and family at the bedside. The Chaplain, Dr Jaynee Eagles, and [place] donor services were notified.   30 minutes were spent preparing discharge.  Mikey Bussing PA-C [facility] Pager [phone] 01/04/2019, 4:44 PM

## 2019-01-21 NOTE — Progress Notes (Signed)
   Jan 16, 2019 1516  Clinical Encounter Type  Visited With Patient and family together  Visit Type Initial;Death;Spiritual support  Referral From Nurse  Consult/Referral To Chaplain  The chaplain responded with spiritual care for Pt. EOL and Pt. family.  The chaplain honored the legacy the Pt. left behind with his family and faith.  The chaplain prayed with family members as they were visiting with each other and respecting their relationship with the  Pt.  The chaplain offered F/U spiritual care if needed.

## 2019-01-21 NOTE — Progress Notes (Signed)
Received from 4n at this time, friends at bedside.

## 2019-01-21 NOTE — Progress Notes (Addendum)
Pt expired at 38 with church members and family at bedside. Chaplain , Dr. Jaynee Eagles, [place] [phone])) notified. Rogelia Rohrer RN 2nd vertified.

## 2019-01-21 NOTE — Progress Notes (Signed)
PCCM: I spoke with neurology.  Patient is extubated to full comfort measures. Pulmonary critical care will sign off at this time. Garner Nash, DO [facility] Jan 03, 2019 12:09 PM

## 2019-01-21 DEATH — deceased

## 2020-05-06 IMAGING — CT CT HEAD W/O CM
4 series · 15 of 47 positions shown, 17 images · non-contrast
Comparison: Head CT 12/14/2018

CLINICAL DATA: Stroke follow-up.

EXAM:
CT HEAD WITHOUT CONTRAST
TECHNIQUE: Contiguous axial images were obtained from the base of the skull
through the vertex without intravenous contrast.

[Series 3: head without · axial · non-contrast · 0.44mm/px · z∈[-58,+57]mm · 6 of 33 slices shown, 8 images]
[im 5/33  brain]
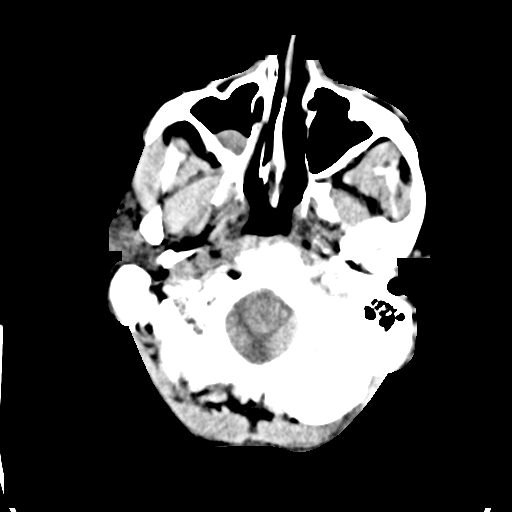
[im 5/33  bone]
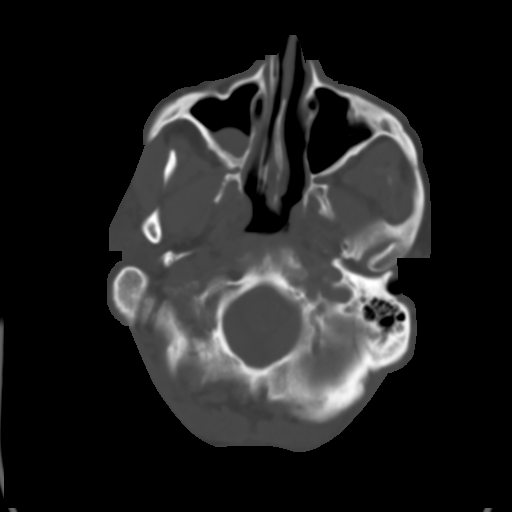
[im 10/33  brain]
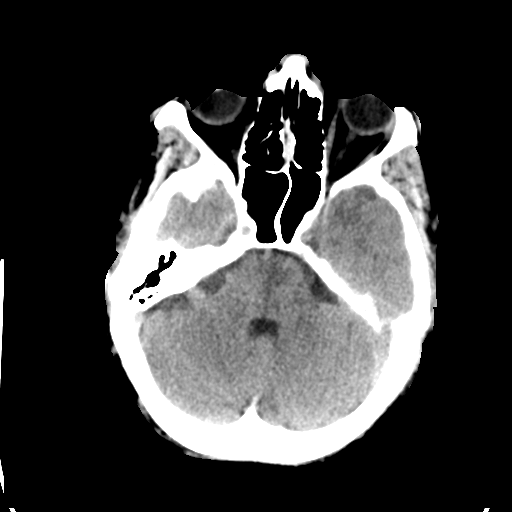
[im 14/33  brain]
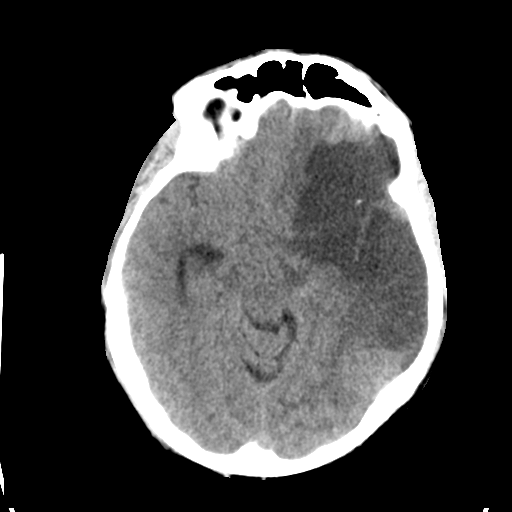
[im 19/33  brain]
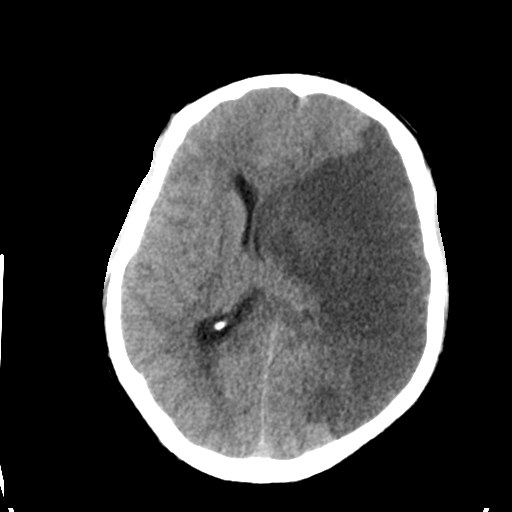
[im 23/33  brain]
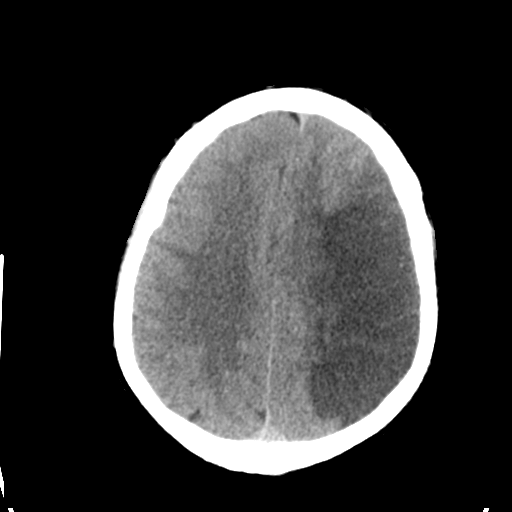
[im 23/33  bone]
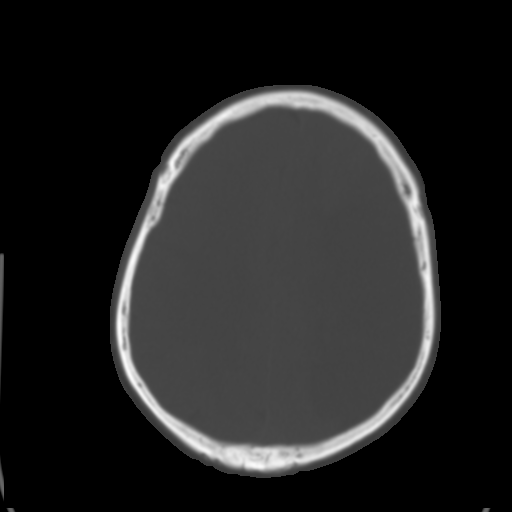
[im 28/33  brain]
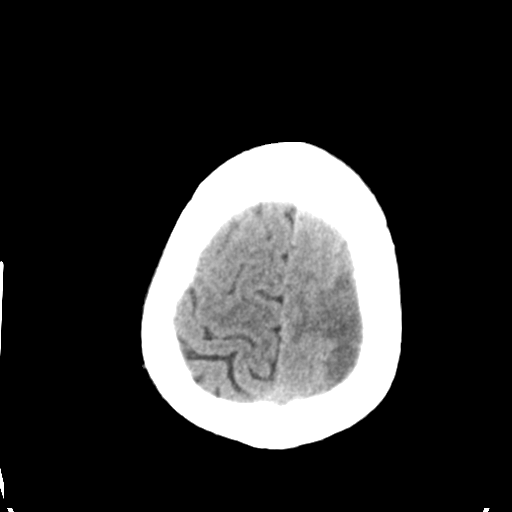

[Series 4: head bone · axial · 0.44mm/px · z∈[-62,-22]mm · 3 of 87 slices shown]
[im 9/87  bone]
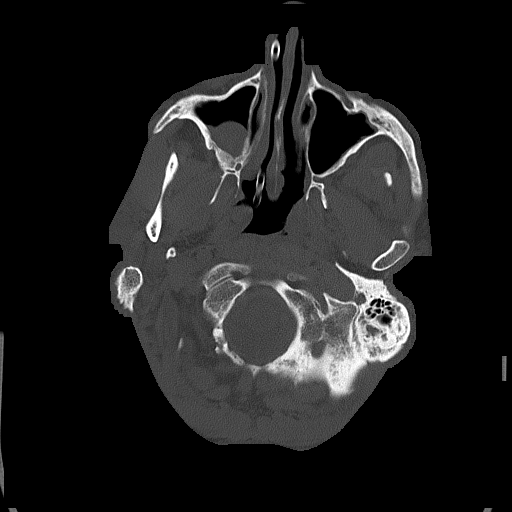
[im 17/87  bone]
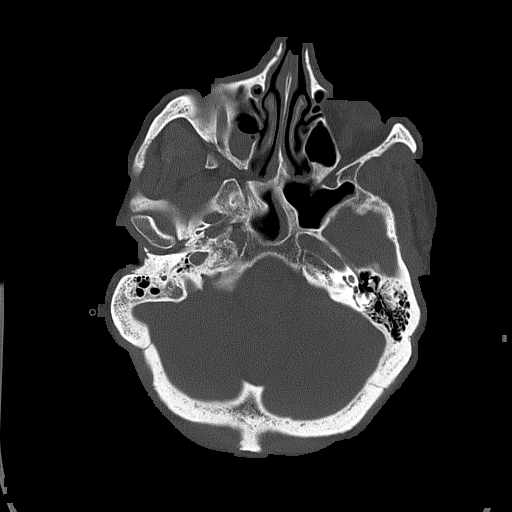
[im 29/87  bone]
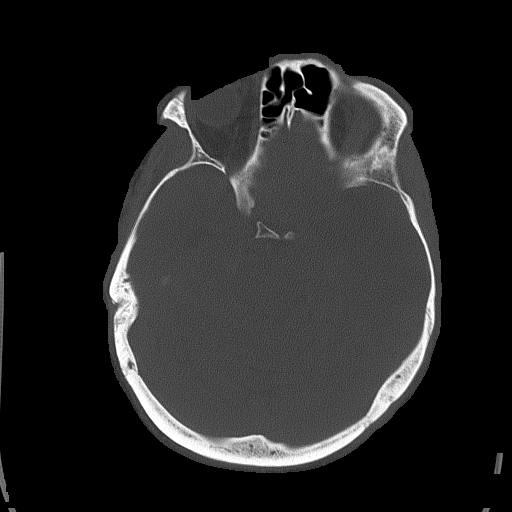

[Series 5: head without cor · coronal · non-contrast · 0.34mm/px · 3 of 73 slices shown]
[im 25/73  brain]
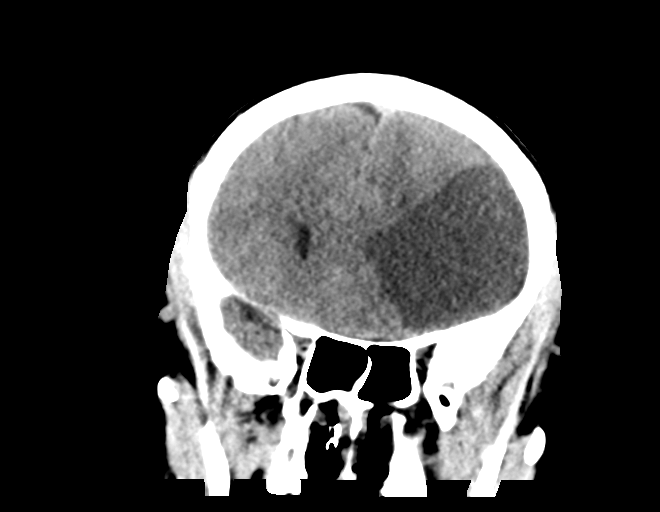
[im 33/73  brain]
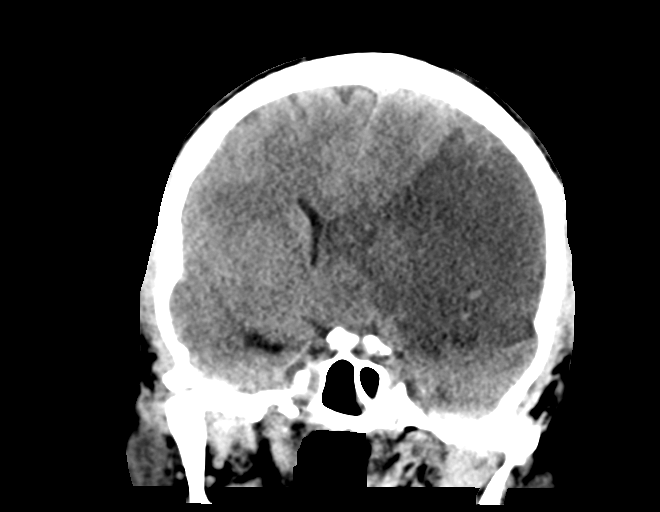
[im 41/73  brain]
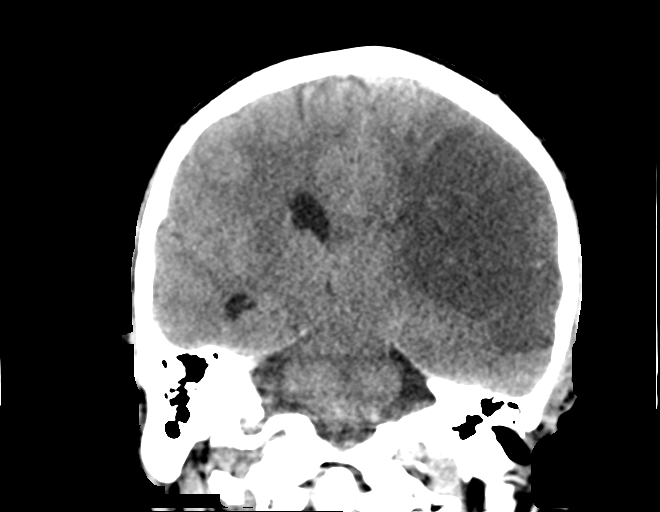

[Series 6: head without sag · sagittal · non-contrast · 0.34mm/px · 3 of 67 slices shown]
[im 23/67  brain]
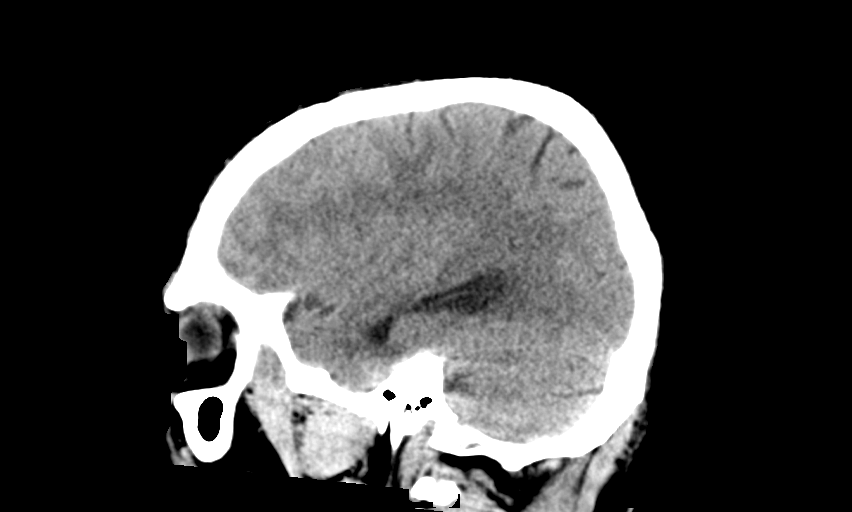
[im 34/67  brain]
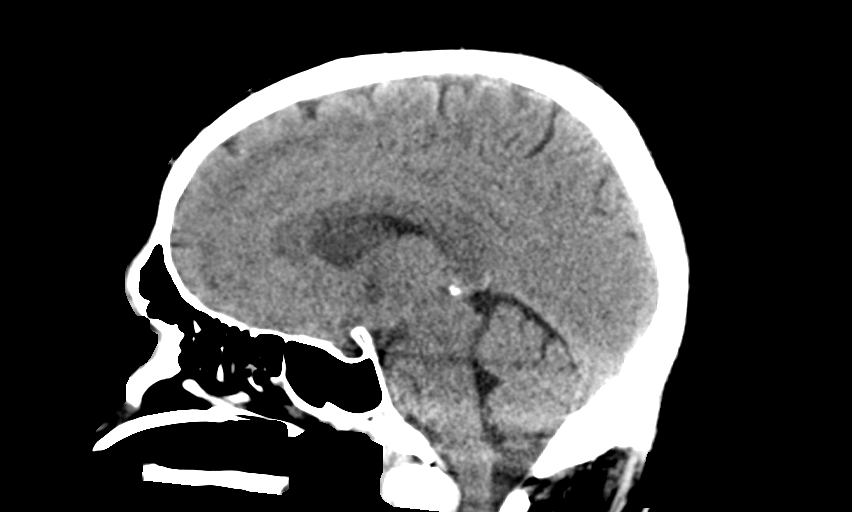
[im 45/67  brain]
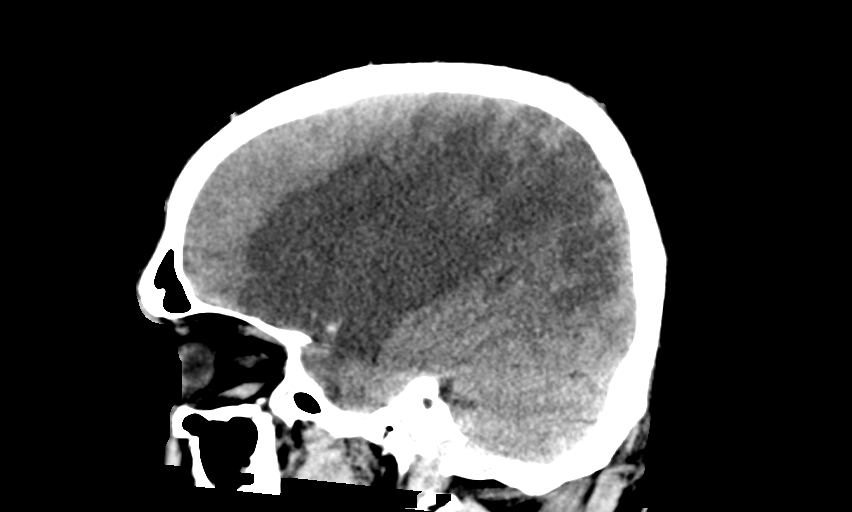

[15 of 47 positions shown; findings below may reference images not displayed]

FINDINGS: Brain: Continued severe edema throughout the left hemisphere MCA
distribution. Midline shift has worsened with 14 mm of rightward
shift and subfalcine herniation of the cingulate gyrus. No
hydrocephalus. The left lateral ventricle is effaced. There is no
acute hemorrhage.

Vascular: Hyperdensity throughout the left MCA and its branches.

Skull: The visualized skull base, calvarium and extracranial soft
tissues are normal.

Sinuses/Orbits: No fluid levels or advanced mucosal thickening of
the visualized paranasal sinuses. No mastoid or middle ear effusion.
The orbits are normal.
IMPRESSION: 1. Worsening cerebral edema with rightward midline shift now
measuring 14 mm.
2. Subfalcine herniation of the left cingulate gyrus.
3. No acute hemorrhage.

These results were communicated to Dr. Varun Schaaf at [DATE] on
12/16/2018 by text page via the [employer] messaging system.

## 2020-05-07 IMAGING — CT CT HEAD W/O CM
4 series · 16 of 47 positions shown, 18 images · non-contrast
Comparison: 12/16/2018

CLINICAL DATA: Left hemisphere stroke. Craniotomy for
decompression.

EXAM:
CT HEAD WITHOUT CONTRAST
TECHNIQUE: Contiguous axial images were obtained from the base of the skull
through the vertex without intravenous contrast.

[Series 3: head wo · axial · 0.44mm/px · z∈[-613,-493]mm · 7 of 33 slices shown, 9 images]
[im 5/33  brain]
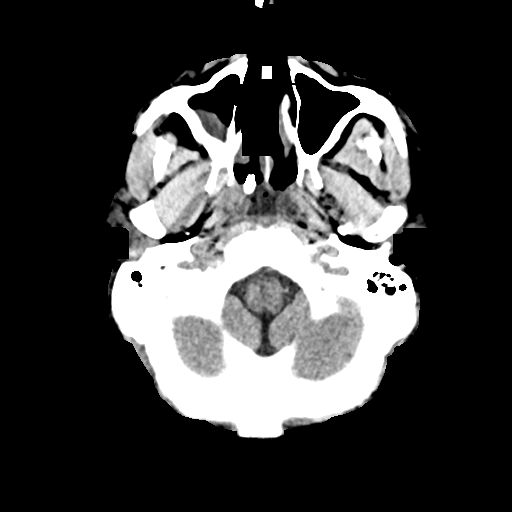
[im 5/33  bone]
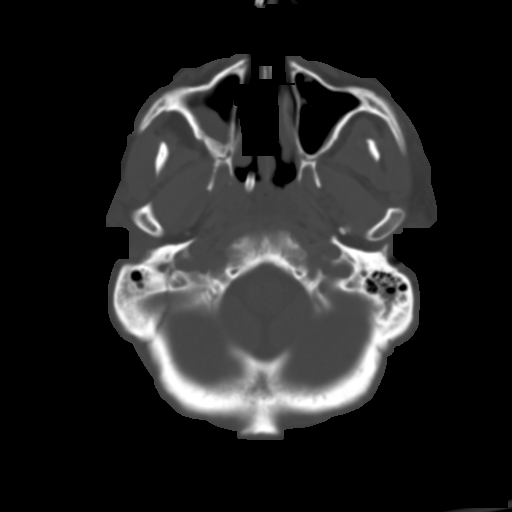
[im 9/33  brain]
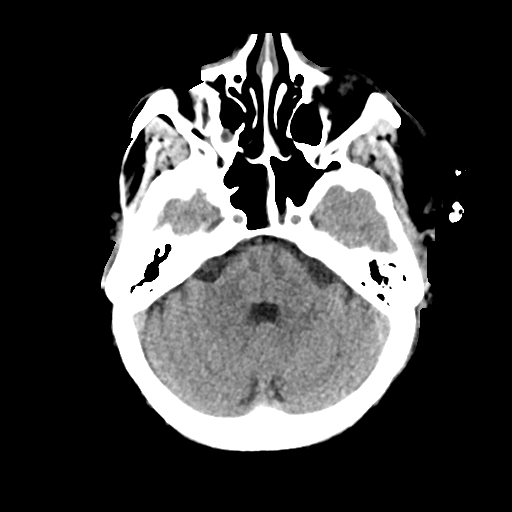
[im 13/33  brain]
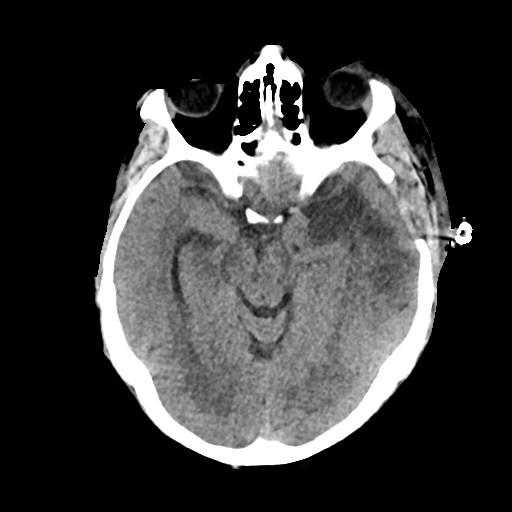
[im 17/33  brain]
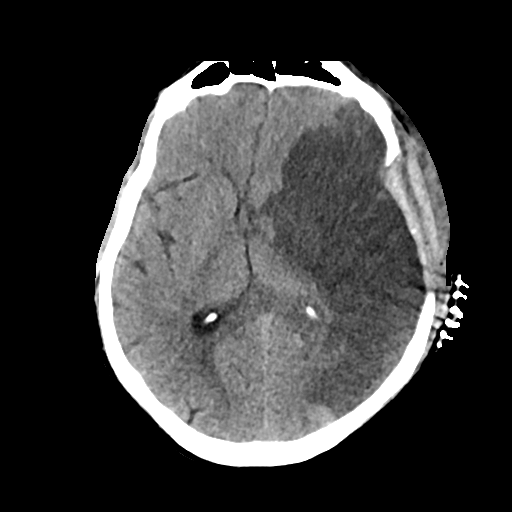
[im 21/33  brain]
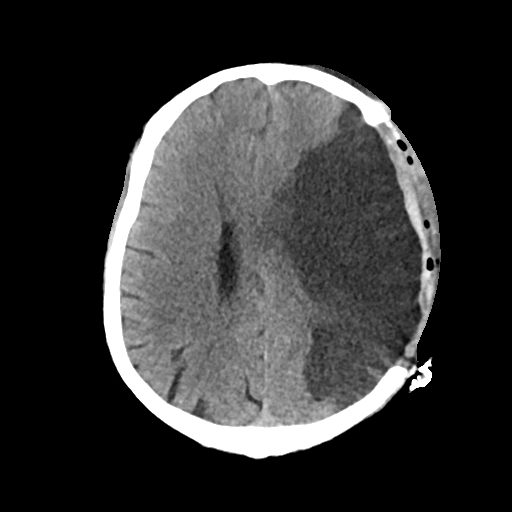
[im 21/33  bone]
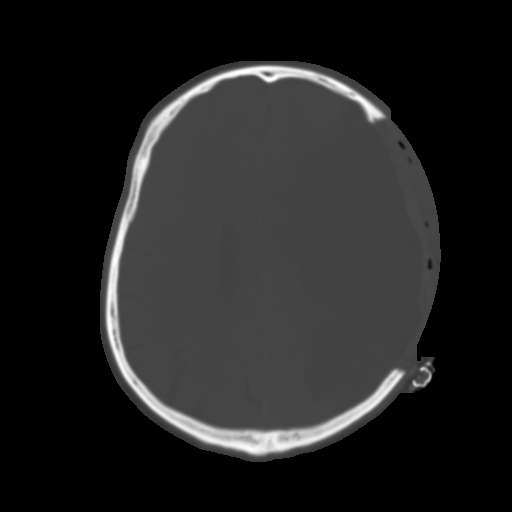
[im 25/33  brain]
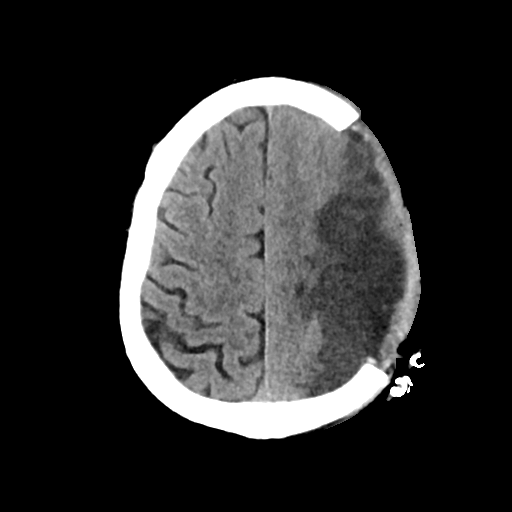
[im 29/33  brain]
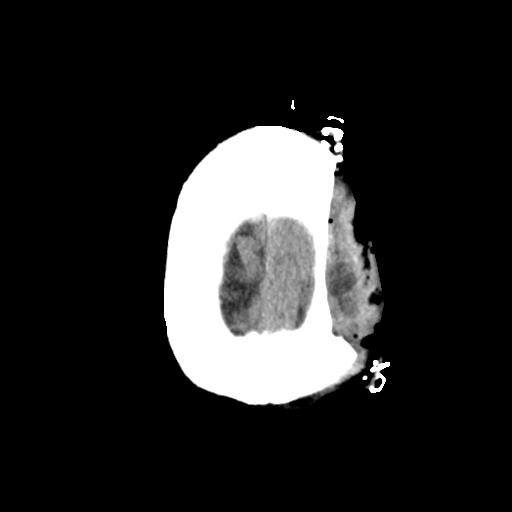

[Series 4: head bone · axial · 0.44mm/px · z∈[-617,-585]mm · 3 of 82 slices shown]
[im 9/82  bone]
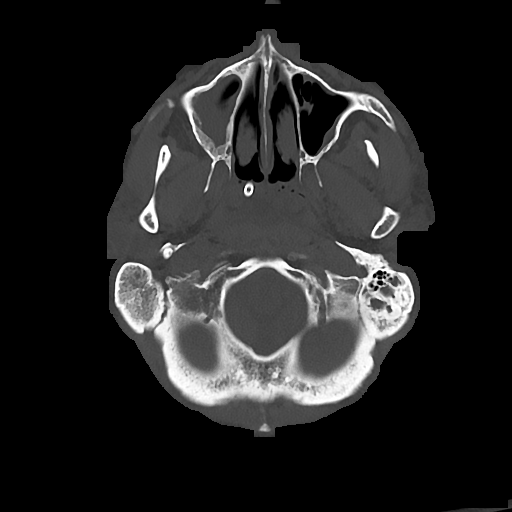
[im 17/82  bone]
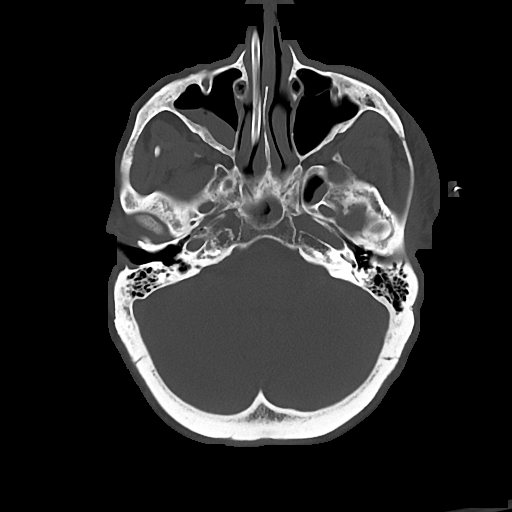
[im 25/82  bone]
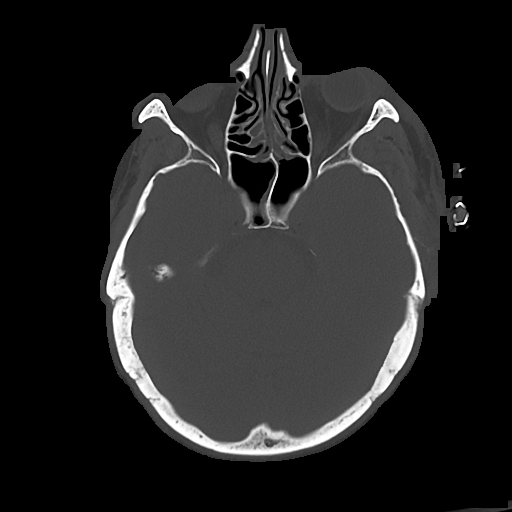

[Series 5: cor soft · coronal · 0.36mm/px · 3 of 63 slices shown]
[im 21/63  brain]
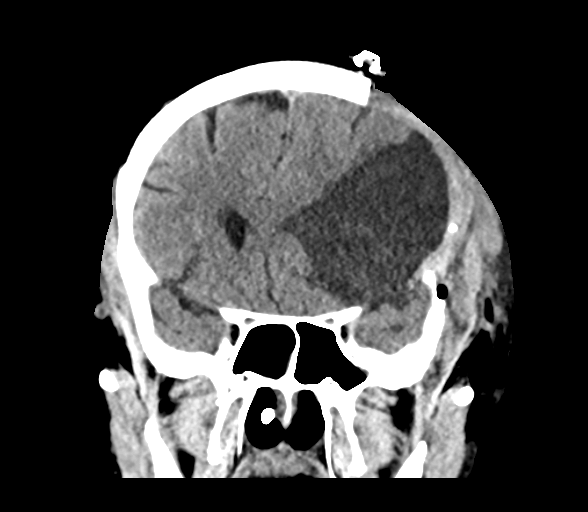
[im 28/63  brain]
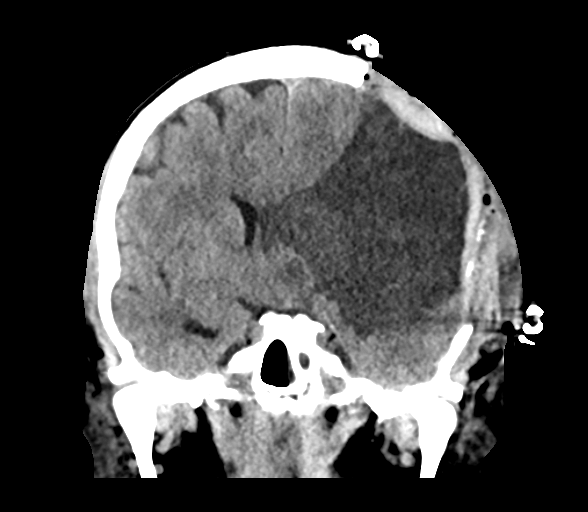
[im 35/63  brain]
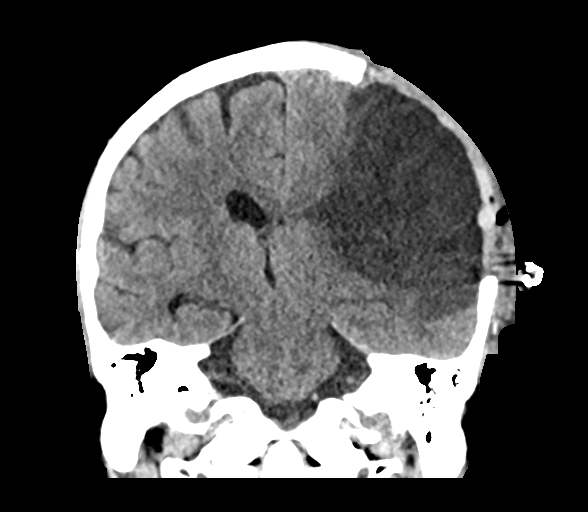

[Series 6: sag soft · sagittal · 0.35mm/px · 3 of 56 slices shown]
[im 19/56  brain]
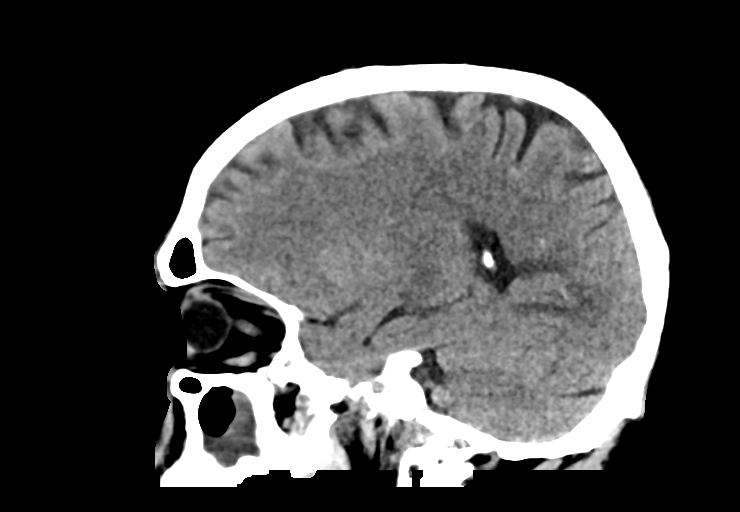
[im 28/56  brain]
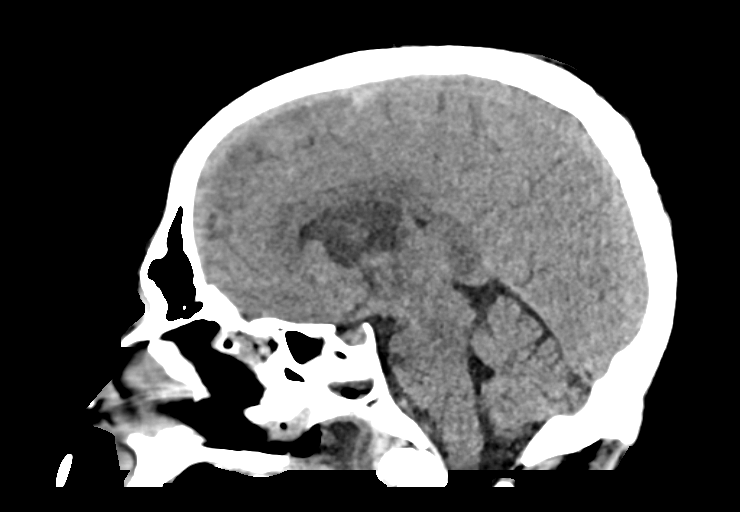
[im 37/56  brain]
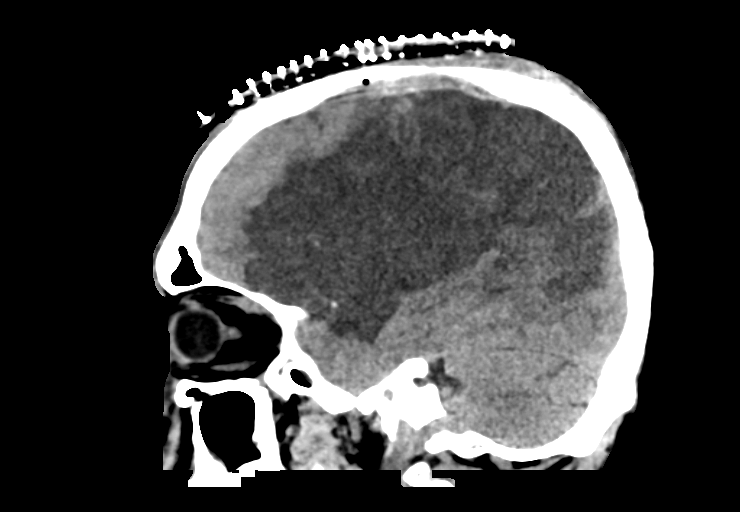

[16 of 47 positions shown; findings below may reference images not displayed]

FINDINGS: Brain: Interval left frontoparietal craniectomy for decompression.
Extreme low-density in the complete left MCA territory consistent
with completed infarction with swelling. No sign of hemorrhage.
Subsequent to the craniectomy, there is less mass effect with
left-to-right shift of 11.5 mm on this study compared with 15.5 mm
on the previous scan. No ventricular trapping. No extra-axial
collection. No new infarction.

Vascular: No acute vascular finding. Hyperdense left MCA as seen
previously.

Skull: Left frontoparietal craniectomy as noted above.

Sinuses/Orbits: Some inflammatory changes of the maxillary sinuses
as noted previously. Orbits negative.

Other: None
IMPRESSION: Interval left frontoparietal craniectomy for decompression. Extreme
low-density in the complete left MCA territory consistent with
completed infarction with swelling. Decreased mass effect with
left-to-right shift of 11.5 mm on this study compared with 15.5 mm
on the previous scan.
